# Patient Record
Sex: Male | Born: 1975 | Race: White | Hispanic: No | Marital: Married | Smoking: Current every day smoker
Health system: Southern US, Community
[De-identification: ages and names within clinical notes are randomized; demographics above are authoritative.]

## PROBLEM LIST (undated history)

## (undated) DIAGNOSIS — Z789 Other specified health status: Secondary | ICD-10-CM

## (undated) HISTORY — PX: ANKLE SURGERY: SHX546

---

## 2006-06-07 ENCOUNTER — Ambulatory Visit: Payer: Self-pay | Admitting: Family Medicine

## 2006-06-07 DIAGNOSIS — L0100 Impetigo, unspecified: Secondary | ICD-10-CM

## 2006-11-29 ENCOUNTER — Ambulatory Visit: Payer: Self-pay | Admitting: Family Medicine

## 2006-11-29 DIAGNOSIS — R1013 Epigastric pain: Secondary | ICD-10-CM | POA: Insufficient documentation

## 2006-12-13 ENCOUNTER — Telehealth: Payer: Self-pay | Admitting: Family Medicine

## 2009-08-11 ENCOUNTER — Ambulatory Visit: Payer: Self-pay | Admitting: Family Medicine

## 2009-08-11 DIAGNOSIS — L659 Nonscarring hair loss, unspecified: Secondary | ICD-10-CM | POA: Insufficient documentation

## 2009-08-12 ENCOUNTER — Encounter: Payer: Self-pay | Admitting: Family Medicine

## 2009-08-29 ENCOUNTER — Ambulatory Visit: Payer: Self-pay | Admitting: Family Medicine

## 2010-05-05 NOTE — Assessment & Plan Note (Signed)
Summary: alopecia   Vital Signs:  Patient profile:   35 year old male Height:      70 inches Weight:      150 pounds BMI:     21.60 O2 Sat:      100 % on Room air Pulse rate:   84 / minute BP sitting:   132 / 83  (left arm) Cuff size:   regular  Vitals Entered By: Payton Spark CMA (Aug 29, 2009 1:50 PM)  O2 Flow:  Room air CC: F/U hair loss.    Primary Care Provider:  Nani Gasser MD  CC:  F/U hair loss. Marland Kitchen  History of Present Illness: 35 yo WM presents for alopecia f/u.  He saw Dr Linford Arnold a month ago.  Had a neg KOH prep.  She put him on Griseofulvin but it has not helped.  He seems to be getting worse everytime he uses his clippers.  he is not soaking them in an antifungal solution.  Denies any itching or scaling.  O/W feels fine.  Current Medications (verified): 1)  Griseofulvin Ultramicrosize 250 Mg Tabs (Griseofulvin Ultramicrosize) .... Take 1 Tablet By Mouth Two Times A Day For 6 Weeks.  Allergies (verified): No Known Drug Allergies  Past History:  Social History: Reviewed history from 06/07/2006 and no changes required. 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  Married to [employer] and no kids.   Current Smoker Drug use-yes Drug use-no Regular exercise-no  Review of Systems      See HPI  Physical Exam  General:  alert, well-developed, well-nourished, and well-hydrated.  here with wife Head:  moth eated appearance of diffuse alopecia, non - scarring. Neck:  no masses.   Skin:  no flaking or scaling Psych:  depressed affect.     Impression & Recommendations:  Problem # 1:  HAIR LOSS (ICD-704.00)  Moth - eating appearance alopecia.  The most common cause is tinea capitus but he has not responded to 4 wks of griseofulvin and had a neg KOH prep.    Will get him in with dermatology to follow.    Orders: Dermatology Referral (Derma)  Complete Medication List: 1)  Griseofulvin Ultramicrosize 250 Mg Tabs (Griseofulvin ultramicrosize) .... Take 1  tablet by mouth two times a day for 6 weeks.  Patient Instructions: 1)  Will get you in with dermatology in [place] for alopecia. 2)  Stop the Griseofulvin.   3)  Clean clippers with fungicide.

## 2010-05-05 NOTE — Assessment & Plan Note (Signed)
Summary: Hair loss   Vital Signs:  Patient profile:   35 year old male Height:      70 inches (177.80 cm) Weight:      153 pounds (69.55 kg) BMI:     22.03 Pulse rate:   68 / minute BP sitting:   123 / 81  (left arm) Cuff size:   regular  Vitals Entered By: Kathlene November (Aug 11, 2009 2:45 PM) CC: loosing patches of hair for 1 month now and getting worse   Primary Care Provider:  Linford Arnold, C  CC:  loosing patches of hair for 1 month now and getting worse.  History of Present Illness: loosing patches of hair for 1 month now and getting worse.  Has had some regrowth of hair in the orginal lesions.  No change in shampoos, soaps or medications that would cause hair loss. It has not affected his beard at all.  Occ itchy.   Current Medications (verified): 1)  None  Allergies (verified): No Known Drug Allergies  Comments:  Nurse/Medical Assistant: The patient's medications and allergies were reviewed with the patient and were updated in the Medication and Allergy Lists. Kathlene November (Aug 11, 2009 2:47 PM)  Physical Exam  General:  Well-developed,well-nourished,in no acute distress; alert,appropriate and cooperative throughout examination Skin:  Diffuse circular areas of hairloss that range from 0.5 cm to 5cm.  All have some ne hair growth. No scaring.  No red scaling or erythema.  They are scattered all over his scalp. No loss on his beard.     Impression & Recommendations:  Problem # 1:  HAIR LOSS (ICD-704.00) BAsed on ciruclar nature suspect fungal infection (tinea capitis).  KOH scraping sent.  In meantime recommend starting a selsun blue or nizoral shampoo instead thought topical tx are usually not effective. Will start griseofulvin.  Orders: T-KOH Prep Fungal ([id number])  Complete Medication List: 1)  Griseofulvin Ultramicrosize 250 Mg Tabs (Griseofulvin ultramicrosize) .... Take 1 tablet by mouth two times a day for 6 weeks. Prescriptions: GRISEOFULVIN ULTRAMICROSIZE  250 MG TABS (GRISEOFULVIN ULTRAMICROSIZE) Take 1 tablet by mouth two times a day for 6 weeks.  #60 x 1   Entered and Authorized by:   Nani Gasser MD   Signed by:   Nani Gasser MD on 08/11/2009   Method used:   Electronically to        [facility] [phone]* (retail)       [address] [place], [place]  [address]       Ph: [phone] or [phone]       Fax: [phone]   RxID:   [id number]

## 2010-07-23 ENCOUNTER — Encounter: Payer: Self-pay | Admitting: Family Medicine

## 2010-07-24 ENCOUNTER — Ambulatory Visit
Admission: RE | Admit: 2010-07-24 | Discharge: 2010-07-24 | Disposition: A | Discharge status: Home / Self Care | Payer: Commercial Indemnity | Source: Ambulatory Visit | Attending: Family Medicine | Admitting: Family Medicine

## 2010-07-24 ENCOUNTER — Ambulatory Visit (INDEPENDENT_AMBULATORY_CARE_PROVIDER_SITE_OTHER): Payer: Commercial Indemnity | Admitting: Family Medicine

## 2010-07-24 VITALS — BP 125/81 | HR 73 | Ht 68.75 in | Wt 144.0 lb

## 2010-07-24 DIAGNOSIS — M79609 Pain in unspecified limb: Secondary | ICD-10-CM

## 2010-07-24 DIAGNOSIS — M79673 Pain in unspecified foot: Secondary | ICD-10-CM

## 2010-07-24 NOTE — Patient Instructions (Signed)
Ibuprofen 200mg  (3 tab up to 3 x a day with food) for swelling and inflammation and pain relief.   We will you with the xray report on Monday.

## 2010-07-24 NOTE — Progress Notes (Signed)
  Subjective:    Patient ID: Jimmy Mcknight, male    DOB: [birth year], 35 y.o.   MRN: [id number]  HPI Left  Foot pain for 3 weeks. Fels like a sharp pain top of outer foot radiating to the lateral malleolus.  No meds for pain.  Worse with going up and on step. Pain is better at rest.  Never saw a bruise or cut No trauma.  Build excavators for a living.    Review of Systems     Objective:   Physical Exam  Constitutional: He appears well-developed and well-nourished.  Musculoskeletal:       Left foot with tenderness over the lateral foot near the talus. Ankle with NROM. No laxity of the joint.  DP pulse 2+. No edema, bruising or rash.           Assessment & Plan:  Ankle pain- will get xray to rule out fracture. If neg can consider a boot to rest the foot or a podiatry referral. I dn't think this is a neuroma or a sprain.

## 2010-07-26 ENCOUNTER — Telehealth: Payer: Self-pay | Admitting: Family Medicine

## 2010-07-26 DIAGNOSIS — M79673 Pain in unspecified foot: Secondary | ICD-10-CM

## 2010-07-26 NOTE — Telephone Encounter (Signed)
Call pt: NO fracture on xray. Can either try wearing a boot or can refer to podiatry for further evaluation.

## 2010-07-27 NOTE — Telephone Encounter (Signed)
Ref entered

## 2010-07-27 NOTE — Telephone Encounter (Signed)
Pt notified and wants referral to podiatrist

## 2011-03-08 ENCOUNTER — Other Ambulatory Visit: Payer: Self-pay | Admitting: Podiatry

## 2011-03-08 DIAGNOSIS — M79672 Pain in left foot: Secondary | ICD-10-CM

## 2011-03-08 DIAGNOSIS — M25572 Pain in left ankle and joints of left foot: Secondary | ICD-10-CM

## 2011-03-12 ENCOUNTER — Ambulatory Visit
Admission: RE | Admit: 2011-03-12 | Discharge: 2011-03-12 | Disposition: A | Discharge status: Home / Self Care | Payer: Commercial Indemnity | Source: Ambulatory Visit | Attending: Podiatry | Admitting: Podiatry

## 2011-03-12 DIAGNOSIS — M25572 Pain in left ankle and joints of left foot: Secondary | ICD-10-CM

## 2011-03-12 DIAGNOSIS — M79672 Pain in left foot: Secondary | ICD-10-CM

## 2011-03-12 MED ORDER — GADOBENATE DIMEGLUMINE 529 MG/ML IV SOLN
14.0000 mL | Freq: Once | INTRAVENOUS | Status: AC | PRN
  Administered 2011-03-12: 14 mL via INTRAVENOUS

## 2013-04-05 HISTORY — PX: ANKLE SURGERY: SHX546

## 2017-08-27 ENCOUNTER — Emergency Department (INDEPENDENT_AMBULATORY_CARE_PROVIDER_SITE_OTHER)
Admission: EM | Admit: 2017-08-27 | Discharge: 2017-08-27 | Disposition: A | Discharge status: Home / Self Care | Payer: 59 | Source: Home / Self Care | Attending: Emergency Medicine | Admitting: Emergency Medicine

## 2017-08-27 ENCOUNTER — Other Ambulatory Visit: Payer: Self-pay

## 2017-08-27 DIAGNOSIS — L03818 Cellulitis of other sites: Secondary | ICD-10-CM | POA: Diagnosis not present

## 2017-08-27 LAB — POCT CBC W AUTO DIFF (K'VILLE URGENT CARE)

## 2017-08-27 MED ORDER — DOXYCYCLINE HYCLATE 100 MG PO CAPS: 100.0000 mg | ORAL_CAPSULE | Freq: Two times a day (BID) | ORAL | 0 refills | Status: DC

## 2017-08-27 MED ORDER — TRAMADOL HCL 50 MG PO TABS: ORAL_TABLET | ORAL | 0 refills | Status: DC

## 2017-08-27 NOTE — Discharge Instructions (Addendum)
Take antibiotics as instructed. Use ibuprofen for pain. You have Ultram to take for breakthrough pain. If you have progression of the redness around the marked area by this evening please go to the emergency room. Please follow-up with your primary care physician next week.

## 2017-08-27 NOTE — ED Provider Notes (Signed)
[facility]    CSN: [id number] Arrival date & time: 08/27/17  8657     History   Chief Complaint Chief Complaint  Patient presents with  . Leg Pain    Back of L leg    HPI Jimmy Mcknight is a 42 y.o. male.   HPI Patient was in his usual state of health until Wednesday or Thursday when he developed severe pain in the popliteal area of his left leg.  He noticed a red area behind his knee.  Since then he has had progressive increase in redness and tenderness around that area.  The area has become extremely painful.  He does not know of any specific bite to the area or injury to the area.  He has no previous history of MRSA. History reviewed. No pertinent past medical history.  Patient Active Problem List   Diagnosis Date Noted  . HAIR LOSS 08/11/2009  . EPIGASTRIC PAIN 11/29/2006    History reviewed. No pertinent surgical history.     Home Medications    Prior to Admission medications   Medication Sig Start Date End Date Taking? Authorizing Provider  doxycycline (VIBRAMYCIN) 100 MG capsule Take 1 capsule (100 mg total) by mouth 2 (two) times daily. 08/27/17   Collene Gobble, MD  traMADol (ULTRAM) 50 MG tablet 1 every 6 hours as needed for pain. 08/27/17   Collene Gobble, MD    Family History History reviewed. No pertinent family history.  Social History Social History   Tobacco Use  . Smoking status: Current Every Day Smoker  . Smokeless tobacco: Never Used  Substance Use Topics  . Alcohol use: Yes    Comment: OCC  . Drug use: Yes     Allergies   Tylenol [acetaminophen]   Review of Systems Review of Systems  Constitutional: Negative for fever.  Respiratory: Negative.   Gastrointestinal: Negative.   Musculoskeletal:       Severe pain behind his left knee.     Physical Exam Triage Vital Signs ED Triage Vitals [08/27/17 1024]  Enc Vitals Group     BP 126/81     Pulse Rate 87     Resp      Temp 98.4 F (36.9 C)     Temp Source Oral       SpO2 98 %     Weight 157 lb (71.2 kg)     Height  (1.778 m)     Head Circumference      Peak Flow      Pain Score 7     Pain Loc      Pain Edu?      Excl. in GC?    No data found.  Updated Vital Signs BP 126/81 (BP Location: Right Arm)   Pulse 87   Temp 98.4 F (36.9 C) (Oral)   Ht  (1.778 m)   Wt 157 lb (71.2 kg)   SpO2 98%   BMI 22.53 kg/m   Visual Acuity Right Eye Distance:   Left Eye Distance:   Bilateral Distance:    Right Eye Near:   Left Eye Near:    Bilateral Near:     Physical Exam  Musculoskeletal:  Positive findings are related to the left popliteal area.  There is an 18 x 15 cm area of redness.  There is a central area 3 x 3 cm which is exquisitely tender and has a central punctum.     UC Treatments /  Results  Labs (all labs ordered are listed, but only abnormal results are displayed) Labs Reviewed  POCT CBC W AUTO DIFF ([facility])    EKG None  Radiology No results found.  Procedures Procedures (including critical care time)  Medications Ordered in UC Medications - No data to display  Initial Impression / Assessment and Plan / UC Course  I have reviewed the triage vital signs and the nursing notes.  Pertinent labs & imaging results that were available during my care of the patient were reviewed by me and considered in my medical decision making (see chart for details). This could be an insect bite or spider bite to the back of his left knee.  It may also be a cyst that has become infected.  We will treat with doxycycline 100 mg twice a day.  He was given instructions to go to the emergency room if any worsening or progression of the marked areas of cellulitis.  CBC shows a white count 7300 hemoglobin 13.3 platelet count 251,000.  He is afebrile.     Final Clinical Impressions(s) / UC Diagnoses   Final diagnoses:  Cellulitis of other specified site     Discharge Instructions     Take antibiotics as  instructed. Use ibuprofen for pain. You have Ultram to take for breakthrough pain. If you have progression of the redness around the marked area by this evening please go to the emergency room.    ED Prescriptions    Medication Sig Dispense Auth. Provider   doxycycline (VIBRAMYCIN) 100 MG capsule Take 1 capsule (100 mg total) by mouth 2 (two) times daily. 20 capsule Collene Gobble, MD   traMADol (ULTRAM) 50 MG tablet 1 every 6 hours as needed for pain. 15 tablet Collene Gobble, MD     Controlled Substance Prescriptions [place] Controlled Substance Registry consulted? Yes, I have consulted the [place] Controlled Substances Registry for this patient, and feel the risk/benefit ratio today is favorable for proceeding with this prescription for a controlled substance.   Collene Gobble, MD 08/27/17 1135

## 2017-08-27 NOTE — ED Triage Notes (Signed)
Pt c/o red bump on back of L leg x 2 days. Red and hot to touch. Not sure if he was bit by anything. Has tried "pain cream" and ibuprofen with no relief.

## 2017-08-28 ENCOUNTER — Telehealth: Payer: Self-pay | Admitting: Emergency Medicine

## 2017-08-29 NOTE — Telephone Encounter (Signed)
Attempted to call, phone not in service.

## 2017-08-30 ENCOUNTER — Encounter: Payer: Self-pay | Admitting: Emergency Medicine

## 2017-08-30 ENCOUNTER — Emergency Department (INDEPENDENT_AMBULATORY_CARE_PROVIDER_SITE_OTHER)
Admission: EM | Admit: 2017-08-30 | Discharge: 2017-08-30 | Disposition: A | Discharge status: Home / Self Care | Payer: 59 | Source: Home / Self Care | Attending: Emergency Medicine | Admitting: Emergency Medicine

## 2017-08-30 ENCOUNTER — Other Ambulatory Visit: Payer: Self-pay

## 2017-08-30 DIAGNOSIS — L02416 Cutaneous abscess of left lower limb: Secondary | ICD-10-CM | POA: Diagnosis not present

## 2017-08-30 DIAGNOSIS — L03116 Cellulitis of left lower limb: Secondary | ICD-10-CM | POA: Diagnosis not present

## 2017-08-30 MED ORDER — TRAMADOL HCL 50 MG PO TABS: 50.0000 mg | ORAL_TABLET | Freq: Four times a day (QID) | ORAL | 0 refills | Status: DC | PRN

## 2017-08-30 NOTE — Discharge Instructions (Addendum)
°  Recheck in the morning. Continue antibiotics. Apply heat to the area. You have Ultram for breakthrough pain.

## 2017-08-30 NOTE — ED Provider Notes (Addendum)
[facility]    CSN: [id number] Arrival date & time: 08/30/17  1404     History   Chief Complaint Chief Complaint  Patient presents with  . Leg Pain    left leg    HPI Jimmy Mcknight is a 42 y.o. male.  Patient seen 08/27/2017 with what appeared to be a cellulitis in the left popliteal area.  He was started on doxycycline and given Ultram for breakthrough pain.  Since that time he has had increasing discomfort in the back of his left knee with difficulty squatting.  He has had no fever chills or other symptoms.  He has been able to eat without difficulty.  The area of redness around the back of his knee has improved.  He has developed a central swollen area which is significantly tender to touch.  Leg Pain    History reviewed. No pertinent past medical history.  Patient Active Problem List   Diagnosis Date Noted  . HAIR LOSS 08/11/2009  . EPIGASTRIC PAIN 11/29/2006    History reviewed. No pertinent surgical history.     Home Medications    Prior to Admission medications   Medication Sig Start Date End Date Taking? Authorizing Provider  doxycycline (VIBRAMYCIN) 100 MG capsule Take 1 capsule (100 mg total) by mouth 2 (two) times daily. 08/27/17   Collene Gobble, MD  traMADol (ULTRAM) 50 MG tablet 1 every 6 hours as needed for pain. 08/27/17   Collene Gobble, MD  traMADol (ULTRAM) 50 MG tablet Take 1 tablet (50 mg total) by mouth every 6 (six) hours as needed. 08/30/17   Collene Gobble, MD    Family History History reviewed. No pertinent family history.  Social History Social History   Tobacco Use  . Smoking status: Current Every Day Smoker  . Smokeless tobacco: Never Used  Substance Use Topics  . Alcohol use: Yes    Comment: OCC  . Drug use: Yes     Allergies   Tylenol [acetaminophen]   Review of Systems Review of Systems  Constitutional: Negative.   Gastrointestinal: Negative.   Musculoskeletal:       Patient has pain and swelling behind  his left knee.     Physical Exam Triage Vital Signs ED Triage Vitals  Enc Vitals Group     BP 08/30/17 1421 (!) 144/88     Pulse Rate 08/30/17 1421 99     Resp --      Temp 08/30/17 1421 98.3 F (36.8 C)     Temp Source 08/30/17 1421 Oral     SpO2 08/30/17 1421 97 %     Weight 08/30/17 1422 158 lb (71.7 kg)     Height 08/30/17 1422  (1.778 m)     Head Circumference --      Peak Flow --      Pain Score 08/30/17 1421 7     Pain Loc --      Pain Edu? --      Excl. in GC? --    No data found.  Updated Vital Signs BP (!) 144/88 (BP Location: Left Arm)   Pulse 99   Temp 98.3 F (36.8 C) (Oral)   Ht  (1.778 m)   Wt 158 lb (71.7 kg)   SpO2 97%   BMI 22.67 kg/m   Visual Acuity Right Eye Distance:   Left Eye Distance:   Bilateral Distance:    Right Eye Near:   Left Eye Near:  Bilateral Near:     Physical Exam  Skin:  There is a 2 x 3 cm raised listening mass behind the left knee in the popliteal area.  There is approximately 2 cm of surrounding redness.  The central area is fluctuant.  There are some peripheral pustule-like areas.  The area was cleaned with Betadine x2.  The area was swabbed with saline.  2 cc of 2% plain was injected.  Along with numbing spray.  After this was done a 1-1/2 cm incision was made with return of approximately 2 cc of purulent material along with what appears to be a rim of cyst.  Patient tolerated the procedure well.  2 cm of packing quarter inch was placed.     UC Treatments / Results  Labs (all labs ordered are listed, but only abnormal results are displayed) Labs Reviewed  WOUND CULTURE    EKG None  Radiology No results found.  Procedures Incision and Drainage Date/Time: 08/30/2017 3:00 PM Performed by: Collene Gobble, MD Authorized by: Collene Gobble, MD   Consent:    Consent obtained:  Verbal Location:    Type:  Abscess   Size:  2 x 3 cm Pre-procedure details:    Skin preparation:  Antiseptic wash and  Betadine Anesthesia (see MAR for exact dosages):    Anesthesia method:  Topical application and local infiltration   Local anesthetic:  Lidocaine 1% w/o epi Procedure type:    Complexity:  Simple Procedure details:    Needle aspiration: no     Incision types:  Stab incision   Incision depth:  Subcutaneous   Scalpel blade:  11   Wound management:  Probed and deloculated and debrided   Drainage:  Purulent   Drainage amount:  Copious   Wound treatment:  Drain placed   Packing materials:  1/4 in gauze Post-procedure details:    Patient tolerance of procedure:  Tolerated well, no immediate complications   (including critical care time)  Medications Ordered in UC Medications - No data to display  Initial Impression / Assessment and Plan / UC Course  I have reviewed the triage vital signs and the nursing notes.  Pertinent labs & imaging results that were available during my care of the patient were reviewed by me and considered in my medical decision making (see chart for details).     Patient currently has been on Doxy.  3 days ago there was no drainage to culture.  The surrounding area of redness has improved however he has developed a 2 x 3 cm abscess centrally.  I&D was performed of this abscess.  Culture was sent.  He will continue on doxycycline.  He was given a note to be out of work for the next 3 days and to return to clinic in the morning for packing change.  Patient tolerated the procedure well.  I did refill his Ultram to help with pain.  He has an appointment in the morning for a removal of a cyst on the back of his neck. Final Clinical Impressions(s) / UC Diagnoses   Final diagnoses:  Abscess of left leg  Cellulitis of leg, left     Discharge Instructions      Recheck in the morning. Continue antibiotics. Apply heat to the area. You have Ultram for breakthrough pain.     ED Prescriptions    Medication Sig Dispense Auth. Provider   traMADol (ULTRAM) 50 MG  tablet Take 1 tablet (50 mg total) by mouth every  6 (six) hours as needed. 15 tablet Collene Gobble, MD     Controlled Substance Prescriptions [place] Controlled Substance Registry consulted? Yes, I have consulted the [place] Controlled Substances Registry for this patient, and feel the risk/benefit ratio today is favorable for proceeding with this prescription for a controlled substance.   Collene Gobble, MD 08/30/17 1541    Collene Gobble, MD 08/30/17 1542    Collene Gobble, MD 08/30/17 [phone]

## 2017-08-30 NOTE — ED Triage Notes (Signed)
42 y.o male presents for a recheck of the L leg. Was seen on 5/25, currently taking doxycycline. States the redness has subsided but the pain has worsened.

## 2017-08-31 ENCOUNTER — Emergency Department (INDEPENDENT_AMBULATORY_CARE_PROVIDER_SITE_OTHER)
Admission: EM | Admit: 2017-08-31 | Discharge: 2017-08-31 | Disposition: A | Discharge status: Home / Self Care | Payer: 59 | Source: Home / Self Care | Attending: Emergency Medicine | Admitting: Emergency Medicine

## 2017-08-31 ENCOUNTER — Encounter: Payer: Self-pay | Admitting: *Deleted

## 2017-08-31 ENCOUNTER — Other Ambulatory Visit: Payer: Self-pay

## 2017-08-31 DIAGNOSIS — L02416 Cutaneous abscess of left lower limb: Secondary | ICD-10-CM

## 2017-08-31 NOTE — Discharge Instructions (Addendum)
The abscess left leg was lanced yesterday and packing placed. The results of wound culture from yesterday are not back yet. Today, I removed packing and replaced with new packing.  The infection is improving.  Continue taking the doxycycline antibiotic. Keep the wound area covered with bandage. Return here to urgent care tomorrow to recheck the wound, remove packing and possibly replacing with new packing. Hopefully, wound culture results will be back by tomorrow.

## 2017-08-31 NOTE — ED Triage Notes (Signed)
Pt is here today for a recheck of his leg wound.

## 2017-08-31 NOTE — ED Provider Notes (Addendum)
[facility]    CSN: [id number] Arrival date & time: 08/31/17  0900     History   Chief Complaint Chief Complaint  Patient presents with  . Dressing Change    HPI Jimmy Mcknight is a 42 y.o. male.   HPI Here to recheck left leg wound status post I&D here yesterday. Reviewed notes from visit here yesterday. He feels pain is improving.  Now improved to 3 out of 10 intensity.  Took tramadol earlier this morning and that really helped the pain.   Not needing pain meds at this time. No past medical history on file.  Patient Active Problem List   Diagnosis Date Noted  . HAIR LOSS 08/11/2009  . EPIGASTRIC PAIN 11/29/2006    No past surgical history on file.     Home Medications    Prior to Admission medications   Medication Sig Start Date End Date Taking? Authorizing Provider  doxycycline (VIBRAMYCIN) 100 MG capsule Take 1 capsule (100 mg total) by mouth 2 (two) times daily. 08/27/17   Collene Gobble, MD  traMADol (ULTRAM) 50 MG tablet 1 every 6 hours as needed for pain. 08/27/17   Collene Gobble, MD  traMADol (ULTRAM) 50 MG tablet Take 1 tablet (50 mg total) by mouth every 6 (six) hours as needed. 08/30/17   Collene Gobble, MD    Family History No family history on file.  Social History Social History   Tobacco Use  . Smoking status: Current Every Day Smoker  . Smokeless tobacco: Never Used  Substance Use Topics  . Alcohol use: Yes    Comment: OCC  . Drug use: Yes     Allergies   Tylenol [acetaminophen]   Review of Systems Review of Systems  No fever or chills or nausea or vomiting Physical Exam Triage Vital Signs ED Triage Vitals [08/31/17 0929]  Enc Vitals Group     BP 129/86     Pulse Rate 81     Resp 16     Temp 98.4 F (36.9 C)     Temp Source Oral     SpO2 97 %     Weight      Height      Head Circumference      Peak Flow      Pain Score 3     Pain Loc      Pain Edu?      Excl. in GC?    No data found.  Updated Vital  Signs BP 129/86 (BP Location: Right Arm)   Pulse 81   Temp 98.4 F (36.9 C) (Oral)   Resp 16   SpO2 97%   Visual Acuity Right Eye Distance:   Left Eye Distance:   Bilateral Distance:    Right Eye Near:   Left Eye Near:    Bilateral Near:     Physical Exam  Left leg wound, tender, minimal drainage.  I replaced the packing.  No red streaks UC Treatments / Results  Labs (all labs ordered are listed, but only abnormal results are displayed) Labs Reviewed - No data to display  EKG None  Radiology No results found.  Procedures Procedures (including critical care time)  Medications Ordered in UC Medications - No data to display  Initial Impression / Assessment and Plan / UC Course  I have reviewed the triage vital signs and the nursing notes.  Pertinent labs & imaging results that were available during my care of the patient were  reviewed by me and considered in my medical decision making (see chart for details).      Final Clinical Impressions(s) / UC Diagnoses   Final diagnoses:  Abscess of leg, left     Discharge Instructions     The abscess left leg was lanced yesterday and packing placed. The results of wound culture from yesterday are not back yet. Today, I removed packing and replaced with new packing.  The infection is improving.  Continue taking the doxycycline antibiotic. Keep the wound area covered with bandage. Return here to urgent care tomorrow to recheck the wound, remove packing and possibly replacing with new packing. Hopefully, wound culture results will be back by tomorrow.   ED Prescriptions    None     Controlled Substance Prescriptions [place] Controlled Substance Registry consulted? Not Applicable   Lajean Manes, MD 08/31/17 8657    Lajean Manes, MD 08/31/17 [phone]

## 2017-09-01 ENCOUNTER — Other Ambulatory Visit: Payer: Self-pay

## 2017-09-01 ENCOUNTER — Emergency Department (INDEPENDENT_AMBULATORY_CARE_PROVIDER_SITE_OTHER)
Admission: EM | Admit: 2017-09-01 | Discharge: 2017-09-01 | Disposition: A | Discharge status: Home / Self Care | Payer: 59 | Source: Home / Self Care

## 2017-09-01 DIAGNOSIS — L03119 Cellulitis of unspecified part of limb: Secondary | ICD-10-CM

## 2017-09-01 DIAGNOSIS — L02419 Cutaneous abscess of limb, unspecified: Secondary | ICD-10-CM | POA: Diagnosis not present

## 2017-09-01 DIAGNOSIS — Z5189 Encounter for other specified aftercare: Secondary | ICD-10-CM

## 2017-09-01 NOTE — Discharge Instructions (Addendum)
Continue current care.  Return tomorrow evening or Saturday to have packing removed.  Continue taking antibiotics.

## 2017-09-01 NOTE — ED Provider Notes (Signed)
[facility]    CSN: [id number] Arrival date & time: 09/01/17  4098     History   Chief Complaint Chief Complaint  Patient presents with  . Wound Check    HPI Jimmy Mcknight is a 42 y.o. male.   HPI Patient had an I&D done of an abscess on his left popliteal fossa a couple of days ago.  Pain has greatly diminished. History reviewed. No pertinent past medical history.  Patient Active Problem List   Diagnosis Date Noted  . HAIR LOSS 08/11/2009  . EPIGASTRIC PAIN 11/29/2006    History reviewed. No pertinent surgical history.     Home Medications    Prior to Admission medications   Medication Sig Start Date End Date Taking? Authorizing Provider  doxycycline (VIBRAMYCIN) 100 MG capsule Take 1 capsule (100 mg total) by mouth 2 (two) times daily. 08/27/17   Collene Gobble, MD  traMADol (ULTRAM) 50 MG tablet 1 every 6 hours as needed for pain. 08/27/17   Collene Gobble, MD  traMADol (ULTRAM) 50 MG tablet Take 1 tablet (50 mg total) by mouth every 6 (six) hours as needed. 08/30/17   Collene Gobble, MD    Family History History reviewed. No pertinent family history.  Social History Social History   Tobacco Use  . Smoking status: Current Every Day Smoker  . Smokeless tobacco: Never Used  Substance Use Topics  . Alcohol use: Yes    Comment: OCC  . Drug use: Yes     Allergies   Tylenol [acetaminophen]   Review of Systems Review of Systems No new issues.  Physical Exam Triage Vital Signs ED Triage Vitals  Enc Vitals Group     BP 09/01/17 0844 134/83     Pulse Rate 09/01/17 0844 78     Resp --      Temp 09/01/17 0844 98.4 F (36.9 C)     Temp Source 09/01/17 0844 Oral     SpO2 09/01/17 0844 98 %     Weight 09/01/17 0845 159 lb (72.1 kg)     Height --      Head Circumference --      Peak Flow --      Pain Score --      Pain Loc --      Pain Edu? --      Excl. in GC? --    No data found.  Updated Vital Signs BP 134/83 (BP Location:  Right Arm)   Pulse 78   Temp 98.4 F (36.9 C) (Oral)   Wt 159 lb (72.1 kg)   SpO2 98%   BMI 22.81 kg/m   Visual Acuity Right Eye Distance:   Left Eye Distance:   Bilateral Distance:    Right Eye Near:   Left Eye Near:    Bilateral Near:     Physical Exam  Dressing removed and packing removed.  No surrounding erythema.  1 cm hole where the I&D was performed.  Packing re-inserted.  It had a moderate amount of drainage.  Seems to be healing well. UC Treatments / Results  Labs (all labs ordered are listed, but only abnormal results are displayed) Labs Reviewed - No data to display  EKG None  Radiology No results found.  Procedures Procedures (including critical care time)  Medications Ordered in UC Medications - No data to display  Initial Impression / Assessment and Plan / UC Course  I have reviewed the triage vital signs and the nursing  notes.  Pertinent labs & imaging results that were available during my care of the patient were reviewed by me and considered in my medical decision making (see chart for details).     Abscess leg, here for packing removal and recheck. Final Clinical Impressions(s) / UC Diagnoses   Final diagnoses:  Visit for wound check  Cellulitis and abscess of leg     Discharge Instructions     Continue current care.  Return tomorrow evening or Saturday to have packing removed.  Continue taking antibiotics.    ED Prescriptions    None     Controlled Substance Prescriptions [place] Controlled Substance Registry consulted? No   Peyton Najjar, MD 09/01/17 [phone]

## 2017-09-01 NOTE — ED Triage Notes (Signed)
Pt here for wound check.

## 2017-09-02 ENCOUNTER — Telehealth: Payer: Self-pay | Admitting: Emergency Medicine

## 2017-09-02 LAB — WOUND CULTURE
MICRO NUMBER: 90642302
RESULT: NO GROWTH
SPECIMEN QUALITY: ADEQUATE

## 2017-09-02 NOTE — Telephone Encounter (Signed)
Leg is getting better, no growth

## 2017-09-03 ENCOUNTER — Encounter: Payer: Self-pay | Admitting: Emergency Medicine

## 2017-09-03 ENCOUNTER — Emergency Department (INDEPENDENT_AMBULATORY_CARE_PROVIDER_SITE_OTHER)
Admission: EM | Admit: 2017-09-03 | Discharge: 2017-09-03 | Disposition: A | Discharge status: Home / Self Care | Payer: 59 | Source: Home / Self Care | Attending: Family Medicine | Admitting: Family Medicine

## 2017-09-03 DIAGNOSIS — Z5189 Encounter for other specified aftercare: Secondary | ICD-10-CM

## 2017-09-03 DIAGNOSIS — Z4800 Encounter for change or removal of nonsurgical wound dressing: Secondary | ICD-10-CM

## 2017-09-03 NOTE — ED Triage Notes (Signed)
Patient is here for a wound check, located behind left knee. Denies pain.

## 2017-09-03 NOTE — ED Provider Notes (Signed)
[facility]    CSN: [id number] Arrival date & time: 09/03/17  0936     History   Chief Complaint Chief Complaint  Patient presents with  . Wound Check    HPI Garnet SierrasScott A Cornacchia is a 42 y.o. male.   HPI  Garnet SierrasScott A Bulson is a 42 y.o. male presenting to UC for packing removal from [facility]. Pt is taking the antibiotics as prescribed and doing well. Pain has improved significantly since initial presentation.  Denies fever, chills, n/v/d.    History reviewed. No pertinent past medical history.  Patient Active Problem List   Diagnosis Date Noted  . HAIR LOSS 08/11/2009  . EPIGASTRIC PAIN 11/29/2006    History reviewed. No pertinent surgical history.     Home Medications    Prior to Admission medications   Medication Sig Start Date End Date Taking? Authorizing Provider  doxycycline (VIBRAMYCIN) 100 MG capsule Take 1 capsule (100 mg total) by mouth 2 (two) times daily. 08/27/17   Collene Gobbleaub, Steven A, MD  traMADol (ULTRAM) 50 MG tablet 1 every 6 hours as needed for pain. 08/27/17   Collene Gobbleaub, Steven A, MD  traMADol (ULTRAM) 50 MG tablet Take 1 tablet (50 mg total) by mouth every 6 (six) hours as needed. 08/30/17   Collene Gobbleaub, Steven A, MD    Family History History reviewed. No pertinent family history.  Social History Social History   Tobacco Use  . Smoking status: Current Every Day Smoker  . Smokeless tobacco: Never Used  Substance Use Topics  . Alcohol use: Yes    Comment: OCC  . Drug use: Yes     Allergies   Tylenol [acetaminophen]   Review of Systems Review of Systems  Constitutional: Negative for chills and fever.  Musculoskeletal: Negative for arthralgias, joint swelling and myalgias.     Physical Exam Triage Vital Signs ED Triage Vitals  Enc Vitals Group     BP 09/03/17 1016 113/79     Pulse Rate 09/03/17 1016 (!) 104     Resp 09/03/17 1016 16     Temp 09/03/17 1016 98 F (36.7 C)     Temp Source 09/03/17 1016 Oral    SpO2 09/03/17 1016 99 %     Weight 09/03/17 1017 160 lb (72.6 kg)     Height 09/03/17 1017 5\' 10"  (1.778 m)     Head Circumference --      Peak Flow --      Pain Score 09/03/17 1017 0     Pain Loc --      Pain Edu? --      Excl. in GC? --    No data found.  Updated Vital Signs BP 113/79 (BP Location: Right Arm)   Pulse (!) 104   Temp 98 F (36.7 C) (Oral)   Resp 16   Ht 5\' 10"  (1.778 m)   Wt 160 lb (72.6 kg)   SpO2 99%   BMI 22.96 kg/m   Visual Acuity Right Eye Distance:   Left Eye Distance:   Bilateral Distance:    Right Eye Near:   Left Eye Near:    Bilateral Near:     Physical Exam  Constitutional: He appears well-developed and well-nourished. No distress.  Cardiovascular: Regular rhythm.  Pulmonary/Chest: Effort normal.  Musculoskeletal: Normal range of motion. He exhibits tenderness. He exhibits no edema.  Left knee: full ROM, mild tenderness around wound  Skin: Skin is warm and dry. He  is not diaphoretic.  Left posterior knee: well healing abscess with scant yellow discharge with packing in place. After removing packing- granulation tissue forming. Minimally tender. No surrounding fluctuance or induration.   Nursing note and vitals reviewed.    UC Treatments / Results  Labs (all labs ordered are listed, but only abnormal results are displayed) Labs Reviewed - No data to display  EKG None  Radiology No results found.  Procedures Procedures (including critical care time)  Medications Ordered in UC Medications - No data to display  Initial Impression / Assessment and Plan / UC Course  I have reviewed the triage vital signs and the nursing notes.  Pertinent labs & imaging results that were available during my care of the patient were reviewed by me and considered in my medical decision making (see chart for details).     Packing removed. Wound rinsed with saline New packing replaced Home care instructions provided below.   Final Clinical  Impressions(s) / UC Diagnoses   Final diagnoses:  Change or removal of wound packing  Abscess re-check     Discharge Instructions      Please return to urgent care in 2 days for packing removal and wound recheck.     ED Prescriptions    None     Controlled Substance Prescriptions [place] Controlled Substance Registry consulted? Not Applicable   Rolla Plate 09/03/17 1233

## 2017-09-03 NOTE — Discharge Instructions (Addendum)
°  Please return to urgent care in 2 days for packing removal and wound recheck.

## 2017-09-05 ENCOUNTER — Emergency Department (INDEPENDENT_AMBULATORY_CARE_PROVIDER_SITE_OTHER)
Admission: EM | Admit: 2017-09-05 | Discharge: 2017-09-05 | Disposition: A | Discharge status: Home / Self Care | Payer: 59 | Source: Home / Self Care

## 2017-09-05 DIAGNOSIS — Z5189 Encounter for other specified aftercare: Secondary | ICD-10-CM | POA: Diagnosis not present

## 2017-09-05 DIAGNOSIS — L02416 Cutaneous abscess of left lower limb: Secondary | ICD-10-CM

## 2017-09-05 NOTE — ED Provider Notes (Signed)
[facility]    CSN: [id number] Arrival date & time: 09/05/17  1557     History   Chief Complaint No chief complaint on file.   HPI Jimmy Mcknight is a 42 y.o. male.   HPI Patient is here for a recheck of the abscess he had behind his left knee in the popliteal fossa.  It is been I indeed last week and was slow healing.  It is been packed several times.  He has been continuing to work [employer]. No past medical history on file.  Patient Active Problem List   Diagnosis Date Noted  . HAIR LOSS 08/11/2009  . EPIGASTRIC PAIN 11/29/2006    No past surgical history on file.     Home Medications    Prior to Admission medications   Medication Sig Start Date End Date Taking? Authorizing Provider  doxycycline (VIBRAMYCIN) 100 MG capsule Take 1 capsule (100 mg total) by mouth 2 (two) times daily. 08/27/17   Collene Gobbleaub, Steven A, MD  traMADol (ULTRAM) 50 MG tablet 1 every 6 hours as needed for pain. 08/27/17   Collene Gobbleaub, Steven A, MD  traMADol (ULTRAM) 50 MG tablet Take 1 tablet (50 mg total) by mouth every 6 (six) hours as needed. 08/30/17   Collene Gobbleaub, Steven A, MD    Family History No family history on file.  Social History Social History   Tobacco Use  . Smoking status: Current Every Day Smoker  . Smokeless tobacco: Never Used  Substance Use Topics  . Alcohol use: Yes    Comment: OCC  . Drug use: Yes     Allergies   Tylenol [acetaminophen]   Review of Systems Review of Systems No additional complaints  Physical Exam Triage Vital Signs ED Triage Vitals  Enc Vitals Group     BP      Pulse      Resp      Temp      Temp src      SpO2      Weight      Height      Head Circumference      Peak Flow      Pain Score      Pain Loc      Pain Edu?      Excl. in GC?    No data found.  Updated Vital Signs There were no vitals taken for this visit.  Visual Acuity Right Eye Distance:   Left Eye Distance:   Bilateral Distance:    Right Eye Near:     Left Eye Near:    Bilateral Near:     Physical Exam Packing removed.  Wound still is nicely open about 3 mm to 4 mm in diameter, may be 3 to 4 mm deep.  Looks clean and had no pus, only old blood on the packing.  It is completely nontender.  No surrounding erythema.  UC Treatments / Results  Labs (all labs ordered are listed, but only abnormal results are displayed) Labs Reviewed - No data to display  EKG None  Radiology No results found.  Procedures Procedures (including critical care time)  Medications Ordered in UC Medications - No data to display  Initial Impression / Assessment and Plan / UC Course  I have reviewed the triage vital signs and the nursing notes.  Pertinent labs & imaging results that were available during my care of the patient were reviewed by me and considered in my medical decision making (see  chart for details).     Popliteal abscess almost resolved, does not need to be repacked today. Final Clinical Impressions(s) / UC Diagnoses   Final diagnoses:  Cutaneous abscess of left lower extremity  Wound check, abscess     Discharge Instructions     Keep wound clean and covered until fully healed.  Return if problems.    ED Prescriptions    None     Controlled Substance Prescriptions [place] Controlled Substance Registry consulted? No   Peyton Najjar, MD 09/05/17 [phone]

## 2017-09-05 NOTE — Discharge Instructions (Addendum)
Keep wound clean and covered until fully healed.  Return if problems.

## 2018-08-17 ENCOUNTER — Ambulatory Visit (INDEPENDENT_AMBULATORY_CARE_PROVIDER_SITE_OTHER): Payer: 59 | Admitting: Sports Medicine

## 2018-08-17 ENCOUNTER — Ambulatory Visit (INDEPENDENT_AMBULATORY_CARE_PROVIDER_SITE_OTHER): Payer: 59

## 2018-08-17 ENCOUNTER — Other Ambulatory Visit: Payer: Self-pay

## 2018-08-17 ENCOUNTER — Encounter: Payer: Self-pay | Admitting: Sports Medicine

## 2018-08-17 DIAGNOSIS — R1031 Right lower quadrant pain: Secondary | ICD-10-CM | POA: Insufficient documentation

## 2018-08-17 MED ORDER — MELOXICAM 15 MG PO TABS
ORAL_TABLET | ORAL | 3 refills | Status: DC
Start: 2018-08-17 — End: 2019-06-12

## 2018-08-17 NOTE — Assessment & Plan Note (Signed)
Likely hip flexor strain. No palpable hernias on exam. Adding meloxicam, x-rays of the hip and pelvis, lumbar spine. Hip flexor rehab exercises given, return to see me in 2 to 3 weeks and we can consider advanced imaging if no better.

## 2018-08-17 NOTE — Progress Notes (Signed)
Subjective:    CC: New patient visit with the below complaints as noted in HPI:  HPI:  This is a pleasant 43 year old male, couple weeks ago he was trying to lift a riding mower, he did not have immediate pain but as the day went on he started to feel pain in his right groin, with radiation from the back to the groin.  No scrotal or lower pelvic change in appearance, no bulges, no GI symptoms.  Pain is moderate, persistent, localized without radiation, worse with sitting, better with standing and laying flat.  I reviewed the past medical history, family history, social history, surgical history, and allergies today and no changes were needed.  Please see the problem list section below in epic for further details.  Past Medical History: No past medical history on file. Past Surgical History: No past surgical history on file. Social History: Social History   Socioeconomic History  . Marital status: Married    Spouse name: Not on file  . Number of children: Not on file  . Years of education: Not on file  . Highest education level: Not on file  Occupational History  . Not on file  Social Needs  . Financial resource strain: Not on file  . Food insecurity:    Worry: Not on file    Inability: Not on file  . Transportation needs:    Medical: Not on file    Non-medical: Not on file  Tobacco Use  . Smoking status: Current Every Day Smoker  . Smokeless tobacco: Never Used  Substance and Sexual Activity  . Alcohol use: Yes    Comment: OCC  . Drug use: Yes  . Sexual activity: Not on file  Lifestyle  . Physical activity:    Days per week: Not on file    Minutes per session: Not on file  . Stress: Not on file  Relationships  . Social connections:    Talks on phone: Not on file    Gets together: Not on file    Attends religious service: Not on file    Active member of club or organization: Not on file    Attends meetings of clubs or organizations: Not on file    Relationship  status: Not on file  Other Topics Concern  . Not on file  Social History Narrative  . Not on file   Family History: No family history on file. Allergies: Allergies  Allergen Reactions  . Tylenol [Acetaminophen]     Tylenol 3 upsets stomach   Medications: See med rec.  Review of Systems: No headache, visual changes, nausea, vomiting, diarrhea, constipation, dizziness, abdominal pain, skin rash, fevers, chills, night sweats, swollen lymph nodes, weight loss, chest pain, body aches, joint swelling, muscle aches, shortness of breath, mood changes, visual or auditory hallucinations.  Objective:    General: Well Developed, well nourished, and in no acute distress.  Neuro: Alert and oriented x3, extra-ocular muscles intact, sensation grossly intact.  HEENT: Normocephalic, atraumatic, pupils equal round reactive to light, neck supple, no masses, no lymphadenopathy, thyroid nonpalpable.  Skin: Warm and dry, no rashes noted.  Cardiac: Regular rate and rhythm, no murmurs rubs or gallops.  Respiratory: Clear to auscultation bilaterally. Not using accessory muscles, speaking in full sentences.  Abdominal: Soft, nontender, nondistended, positive bowel sounds, no masses, no organomegaly.  Genitourinary exam was unremarkable, no palpable impulses on Valsalva in the internal ring or anteriorly over the pelvis, scrotum is unremarkable and symmetric. Right hip: ROM IR: 60  Deg, ER: 60 Deg, Flexion: 120 Deg, Extension: 100 Deg, Abduction: 45 Deg, Adduction: 45 Deg Strength IR: 5/5, ER: 5/5, Flexion: 5/5, Extension: 5/5, Abduction: 5/5, Adduction: 5/5 Reproduction of pain with resisted flexion of the right hip. Pelvic alignment unremarkable to inspection and palpation. Standing hip rotation and gait without trendelenburg / unsteadiness. Greater trochanter without tenderness to palpation. No tenderness over piriformis. No SI joint tenderness and normal minimal SI movement.  Impression and  Recommendations:    The patient was counselled, risk factors were discussed, anticipatory guidance given.  Right groin pain Likely hip flexor strain. No palpable hernias on exam. Adding meloxicam, x-rays of the hip and pelvis, lumbar spine. Hip flexor rehab exercises given, return to see me in 2 to 3 weeks and we can consider advanced imaging if no better.   ___________________________________________ Ihor Austinhomas J. Benjamin Stainhekkekandam, M.D., ABFM., CAQSM. Primary Care and Sports Medicine  [facility]  Adjunct Professor of Family Medicine  [employer]

## 2018-09-08 ENCOUNTER — Other Ambulatory Visit: Payer: Self-pay

## 2018-09-08 ENCOUNTER — Emergency Department (HOSPITAL_COMMUNITY): Payer: 59

## 2018-09-08 ENCOUNTER — Emergency Department (HOSPITAL_COMMUNITY)
Admission: EM | Admit: 2018-09-08 | Discharge: 2018-09-09 | Disposition: A | Discharge status: Home / Self Care | Payer: 59 | Attending: Emergency Medicine | Admitting: Emergency Medicine

## 2018-09-08 ENCOUNTER — Encounter (HOSPITAL_COMMUNITY): Payer: Self-pay | Admitting: *Deleted

## 2018-09-08 DIAGNOSIS — S022XXB Fracture of nasal bones, initial encounter for open fracture: Secondary | ICD-10-CM | POA: Diagnosis not present

## 2018-09-08 DIAGNOSIS — S0083XA Contusion of other part of head, initial encounter: Secondary | ICD-10-CM | POA: Insufficient documentation

## 2018-09-08 DIAGNOSIS — R600 Localized edema: Secondary | ICD-10-CM | POA: Insufficient documentation

## 2018-09-08 DIAGNOSIS — R456 Violent behavior: Secondary | ICD-10-CM | POA: Insufficient documentation

## 2018-09-08 DIAGNOSIS — Y929 Unspecified place or not applicable: Secondary | ICD-10-CM | POA: Diagnosis not present

## 2018-09-08 DIAGNOSIS — Y908 Blood alcohol level of 240 mg/100 ml or more: Secondary | ICD-10-CM | POA: Insufficient documentation

## 2018-09-08 DIAGNOSIS — Y939 Activity, unspecified: Secondary | ICD-10-CM | POA: Diagnosis not present

## 2018-09-08 DIAGNOSIS — Y999 Unspecified external cause status: Secondary | ICD-10-CM | POA: Diagnosis not present

## 2018-09-08 DIAGNOSIS — F10920 Alcohol use, unspecified with intoxication, uncomplicated: Secondary | ICD-10-CM | POA: Insufficient documentation

## 2018-09-08 DIAGNOSIS — S0993XA Unspecified injury of face, initial encounter: Secondary | ICD-10-CM | POA: Diagnosis present

## 2018-09-08 DIAGNOSIS — L559 Sunburn, unspecified: Secondary | ICD-10-CM | POA: Insufficient documentation

## 2018-09-08 DIAGNOSIS — F17200 Nicotine dependence, unspecified, uncomplicated: Secondary | ICD-10-CM | POA: Diagnosis not present

## 2018-09-08 LAB — CBC WITH DIFFERENTIAL/PLATELET
Abs Immature Granulocytes: 0.01 10*3/uL (ref 0.00–0.07)
Basophils Absolute: 0.1 10*3/uL (ref 0.0–0.1)
Basophils Relative: 1 %
Eosinophils Absolute: 0.5 10*3/uL (ref 0.0–0.5)
Eosinophils Relative: 8 %
HCT: 42.5 % (ref 39.0–52.0)
Hemoglobin: 14.4 g/dL (ref 13.0–17.0)
Immature Granulocytes: 0 %
Lymphocytes Relative: 22 %
Lymphs Abs: 1.4 10*3/uL (ref 0.7–4.0)
MCH: 30.3 pg (ref 26.0–34.0)
MCHC: 33.9 g/dL (ref 30.0–36.0)
MCV: 89.3 fL (ref 80.0–100.0)
Monocytes Absolute: 0.4 10*3/uL (ref 0.1–1.0)
Monocytes Relative: 6 %
Neutro Abs: 3.8 10*3/uL (ref 1.7–7.7)
Neutrophils Relative %: 63 %
Platelets: 247 10*3/uL (ref 150–400)
RBC: 4.76 MIL/uL (ref 4.22–5.81)
RDW: 12.1 % (ref 11.5–15.5)
WBC: 6.2 10*3/uL (ref 4.0–10.5)
nRBC: 0 % (ref 0.0–0.2)

## 2018-09-08 MED ORDER — TETANUS-DIPHTH-ACELL PERTUSSIS 5-2.5-18.5 LF-MCG/0.5 IM SUSP
0.5000 mL | Freq: Once | INTRAMUSCULAR | Status: DC
  Filled 2018-09-08: qty 0.5

## 2018-09-08 NOTE — ED Notes (Signed)
Pt to CT via stretcher

## 2018-09-08 NOTE — ED Notes (Signed)
Pt's wife Clearnce Camisa  [phone] updated on status and we can call her for ride when discharged.
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
## 2018-09-08 NOTE — Consult Note (Signed)
Responded to Lvl 2 page, pt unavailable, no family present. Staff will page if further need of chaplain services.  Rev. Donnel Saxon Chaplain

## 2018-09-09 LAB — URINALYSIS, ROUTINE W REFLEX MICROSCOPIC
Bacteria, UA: NONE SEEN
Bilirubin Urine: NEGATIVE
Glucose, UA: NEGATIVE mg/dL
Ketones, ur: NEGATIVE mg/dL
Leukocytes,Ua: NEGATIVE
Nitrite: NEGATIVE
Protein, ur: NEGATIVE mg/dL
Specific Gravity, Urine: 1.008 (ref 1.005–1.030)
pH: 5 (ref 5.0–8.0)

## 2018-09-09 LAB — COMPREHENSIVE METABOLIC PANEL
ALT: 40 U/L (ref 0–44)
AST: 31 U/L (ref 15–41)
Albumin: 4.3 g/dL (ref 3.5–5.0)
Alkaline Phosphatase: 71 U/L (ref 38–126)
Anion gap: 12 (ref 5–15)
BUN: 10 mg/dL (ref 6–20)
CO2: 21 mmol/L — ABNORMAL LOW (ref 22–32)
Calcium: 9.2 mg/dL (ref 8.9–10.3)
Chloride: 104 mmol/L (ref 98–111)
Creatinine, Ser: 0.88 mg/dL (ref 0.61–1.24)
GFR calc Af Amer: >60 mL/min (ref >60)
GFR calc non Af Amer: >60 mL/min (ref >60)
Glucose, Bld: 110 mg/dL — ABNORMAL HIGH (ref 70–99)
Potassium: 3.8 mmol/L (ref 3.5–5.1)
Sodium: 137 mmol/L (ref 135–145)
Total Bilirubin: 0.9 mg/dL (ref 0.3–1.2)
Total Protein: 7.2 g/dL (ref 6.5–8.1)

## 2018-09-09 LAB — ETHANOL: Alcohol, Ethyl (B): 324 mg/dL (ref <10)

## 2018-09-09 MED ORDER — HALOPERIDOL LACTATE 5 MG/ML IJ SOLN
5.0000 mg | Freq: Once | INTRAMUSCULAR | Status: AC
  Administered 2018-09-09: 5 mg via INTRAMUSCULAR

## 2018-09-09 MED ORDER — LORAZEPAM 2 MG/ML IJ SOLN
2.0000 mg | Freq: Once | INTRAMUSCULAR | Status: AC
  Administered 2018-09-09: 2 mg via INTRAMUSCULAR

## 2018-09-09 MED ORDER — CEPHALEXIN 500 MG PO CAPS: 500.0000 mg | ORAL_CAPSULE | Freq: Three times a day (TID) | ORAL | 0 refills | Status: DC

## 2018-09-09 MED ORDER — LORAZEPAM 2 MG/ML IJ SOLN
1.0000 mg | Freq: Once | INTRAMUSCULAR | Status: DC
  Filled 2018-09-09: qty 1

## 2018-09-09 MED ORDER — HALOPERIDOL LACTATE 5 MG/ML IJ SOLN
2.5000 mg | Freq: Once | INTRAMUSCULAR | Status: DC
  Filled 2018-09-09: qty 1

## 2018-09-09 MED ORDER — IOHEXOL 300 MG/ML  SOLN
100.0000 mL | Freq: Once | INTRAMUSCULAR | Status: AC | PRN
  Administered 2018-09-08: 100 mL via INTRAVENOUS

## 2018-09-09 NOTE — ED Notes (Signed)
Brianne RN spoke with patients wife Starla Link, she will pick up patient ASAP (30-60min)

## 2018-09-09 NOTE — ED Notes (Signed)
RN attempted x5 to get Pt to drink liquids as well as get out of bed to ambulate. Pt refuses to drink fluids and ambulate x5
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
## 2018-09-09 NOTE — ED Notes (Signed)
Pts wife updated on restraints and monitoring period.
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
## 2018-09-09 NOTE — ED Notes (Signed)
RN informed by Pt's MD to discontinue Violent restricts.

## 2018-09-09 NOTE — ED Notes (Signed)
Pt tolerated ambulating fairly. Pt tolerated fluids

## 2018-09-09 NOTE — ED Notes (Signed)
CT tech request assistance, pt combative, pt has removed all monitoring devices and IV, pt throwing items and threatening staff and [employer].  Pt refusing to allow to be placed back on CCM.
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
## 2018-09-11 ENCOUNTER — Encounter: Payer: Self-pay | Admitting: Sports Medicine

## 2019-06-12 ENCOUNTER — Encounter: Payer: Self-pay | Admitting: Sports Medicine

## 2019-06-12 ENCOUNTER — Other Ambulatory Visit: Payer: Self-pay

## 2019-06-12 ENCOUNTER — Ambulatory Visit (INDEPENDENT_AMBULATORY_CARE_PROVIDER_SITE_OTHER): Payer: 59 | Admitting: Sports Medicine

## 2019-06-12 DIAGNOSIS — M5136 Other intervertebral disc degeneration, lumbar region: Secondary | ICD-10-CM

## 2019-06-12 DIAGNOSIS — M51369 Other intervertebral disc degeneration, lumbar region without mention of lumbar back pain or lower extremity pain: Secondary | ICD-10-CM | POA: Insufficient documentation

## 2019-06-12 MED ORDER — MELOXICAM 15 MG PO TABS: ORAL_TABLET | ORAL | 3 refills | Status: DC

## 2019-06-12 MED ORDER — PREDNISONE 50 MG PO TABS
ORAL_TABLET | ORAL | 0 refills | Status: DC
Start: 2019-06-12 — End: 2019-07-24

## 2019-06-12 NOTE — Progress Notes (Signed)
    Procedures performed today:    None.  Independent interpretation of notes and tests performed by another provider:   On personal review of his CT abdomen and pelvis from last summer he has a small to moderate disc protrusion at the L4-L5 level.  Impression and Recommendations:    Lumbar degenerative disc disease Jimmy Mcknight returns, he had pain in the right side of his low back since last summer. It is axial, nothing radicular, no red flags. On personal review of his CT abdomen and pelvis from last summer he has a small to moderate disc protrusion at the L4-L5 level. This is likely the cause of his pain, adding 5 days of prednisone, meloxicam, formal physical therapy. Return to see me in 4 to 6 weeks, MRI for interventional planning if no better.    ___________________________________________ Ihor Austin. Benjamin Stain, M.D., ABFM., CAQSM. Primary Care and Sports Medicine [facility]  Adjunct Instructor of Family Medicine  [employer]

## 2019-06-12 NOTE — Assessment & Plan Note (Signed)
Keshav returns, he had pain in the right side of his low back since last summer. It is axial, nothing radicular, no red flags. On personal review of his CT abdomen and pelvis from last summer he has a small to moderate disc protrusion at the L4-L5 level. This is likely the cause of his pain, adding 5 days of prednisone, meloxicam, formal physical therapy. Return to see me in 4 to 6 weeks, MRI for interventional planning if no better.

## 2019-06-21 ENCOUNTER — Other Ambulatory Visit: Payer: Self-pay

## 2019-06-21 ENCOUNTER — Ambulatory Visit (INDEPENDENT_AMBULATORY_CARE_PROVIDER_SITE_OTHER): Payer: 59 | Admitting: Rehabilitative and Restorative Service Providers"

## 2019-06-21 ENCOUNTER — Encounter: Payer: Self-pay | Admitting: Rehabilitative and Restorative Service Providers"

## 2019-06-21 DIAGNOSIS — M545 Low back pain: Secondary | ICD-10-CM

## 2019-06-21 DIAGNOSIS — G8929 Other chronic pain: Secondary | ICD-10-CM | POA: Diagnosis not present

## 2019-06-21 DIAGNOSIS — R29898 Other symptoms and signs involving the musculoskeletal system: Secondary | ICD-10-CM

## 2019-06-21 NOTE — Therapy (Signed)
[facility] [address] [place], [place], [address] Phone: [phone]   Fax:  [phone]  Physical Therapy Evaluation  Patient Details  Name: Jimmy Mcknight MRN: [id number] Date of Birth: 07-09-1975 Referring Provider (PT): Dr Benjamin Stain    Encounter Date: 06/21/2019  PT End of Session - 06/21/19 1655    Visit Number  1    Number of Visits  12    Date for PT Re-Evaluation  08/02/19    PT Start Time  1445    PT Stop Time  1544    PT Time Calculation (min)  59 min    Activity Tolerance  Patient tolerated treatment well       History reviewed. No pertinent past medical history.  History reviewed. No pertinent surgical history.  There were no vitals filed for this visit.   Subjective Assessment - 06/21/19 1537    Subjective  Patient reports that he has pain in the Rt LB since 7/20 following irritation/injury lifting a lawn mower. Symptoms have persisted and have gotten worse in the past few weeks or months.    Pertinent History  ankle surgery ~ 8 yrs ago for torn tendons    Patient Stated Goals  get rid of back pain    Currently in Pain?  Yes    Pain Score  2     Pain Location  Back    Pain Orientation  Right    Pain Descriptors / Indicators  Sharp    Pain Type  Chronic pain    Pain Onset  More than a month ago    Pain Frequency  Constant   last 1-2 weeks   Aggravating Factors   stooping; bending a certain way; getting up off the couch; sometimes getting out of bed; lifting    Pain Relieving Factors  meds have helped some         [facility] PT Assessment - 06/21/19 0001      Assessment   Medical Diagnosis  Rt sided LBP     Referring Provider (PT)  Dr Benjamin Stain     Onset Date/Surgical Date  10/18/19    Hand Dominance  Right    Next MD Visit  07/24/19    Prior Therapy  none       Precautions   Precautions  None      Balance Screen   Has the patient fallen in the past 6 months  No    Has the patient  had a decrease in activity level because of a fear of falling?   No    Is the patient reluctant to leave their home because of a fear of falling?   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 70 # bent forward much of the day lifting floor to waist height lifting 5-10 times in an hour 8 hours/day     Leisure  yard work; sedentary; boating; tubing       Observation/Other Assessments   Focus on Therapeutic Outcomes (FOTO)   20% limitation       Sensation   Additional Comments  WFL's per pt report       Posture/Postural Control   Posture Comments  rounded shoulders in  sitting and standing; sits with rounded lumbar spine       AROM   Lumbar Flexion  70% pain in the Rt LB    Lumbar Extension  20% pain in the Rt LB    Lumbar - Right Side Bend  75% pain in the Rt LB    Lumbar - Left Side Bend  70% pulling Rt LB no pain     Lumbar - Right Rotation  70% no pain     Lumbar - Left Rotation  60% no pain       Strength   Overall Strength Comments  WFL's bilat LEs       Flexibility   Hamstrings  mild tightness Rt    Quadriceps  WFL's bilat     ITB  mild tightness Rt     Piriformis  tight Rt       Palpation   Spinal mobility  WFL's PA and lateral spring testing     Palpation comment  muscular tightness Rt lumbar paraspinals and QL/lats                 Objective measurements completed on examination: See above findings.      [facility] Adult PT Treatment/Exercise - 06/21/19 0001      Therapeutic Activites    Therapeutic Activities  --   hinged hip sit to stand and lifting techniques      Neuro Re-ed    Neuro Re-ed Details   postural correction - core work       Lumbar Exercises: Stretches   Double Knee to Chest Stretch  3 reps;20 seconds    Piriformis Stretch  Right;2 reps;20  seconds;30 seconds   supine piriformis    Piriformis Stretch Limitations  reproduced familiar pain in the Rt LB       Moist Heat Therapy   Number Minutes Moist Heat  14 Minutes    Moist Heat Location  Lumbar Spine      Electrical Stimulation   Electrical Stimulation Location  Rt lumbar/QL    Electrical Stimulation Action  IFC    Electrical Stimulation Parameters  to tolerance    Electrical Stimulation Goals  Pain;Tone             PT Education - 06/21/19 1615    Education Details  POC HEP DN posture    Person(s) Educated  Patient    Methods  Explanation;Demonstration;Tactile cues;Verbal cues;Handout    Comprehension  Verbalized understanding;Returned demonstration;Verbal cues required;Tactile cues required          PT Long Term Goals - 06/21/19 1702      PT LONG TERM GOAL #1   Title  Decrease pain Rt LB by 50-75% allowing patient to return to all normal functional and work activities without pain    Time  6    Period  Weeks    Status  New    Target Date  08/02/19      PT LONG TERM GOAL #2   Title  Restore normal trunk ROM without pain    Time  6    Period  Weeks    Status  New    Target Date  08/02/19      PT LONG TERM GOAL #3   Title  Patient to demonstrate good lifting techniques with hinged hip and core engaged    Time  2    Period  Weeks    Status  New      PT LONG TERM GOAL #  4   Title  Independent in HEP    Time  6    Period  Weeks    Status  New    Target Date  08/02/19      PT LONG TERM GOAL #5   Title  Improve FOTO to </= 17% limitation    Time  6    Period  Weeks    Status  New    Target Date  08/02/19             Plan - 06/21/19 1656    Clinical Impression Statement  Patient presents with chronic Rt LBP which has increased in the past few weeks. He remembers irritating the LB lifting a lawn mower in July 2020. Patient has poor posture and alignment; limited trunk mobility; muscular tightness through the Rt lumbar paraspinals and  QL; poor body mechanics; pain on a daily basis. Patient will benefit from PT to address problems identified.    Stability/Clinical Decision Making  Stable/Uncomplicated    Clinical Decision Making  Low    Rehab Potential  Good    PT Frequency  2x / week    PT Duration  6 weeks    PT Treatment/Interventions  ADLs/Self Care Home Management;Patient/family education;Cryotherapy;Electrical Stimulation;Iontophoresis 4mg /ml Dexamethasone;Moist Heat;Ultrasound;Functional mobility training;Therapeutic activities;Therapeutic exercise;Neuromuscular re-education;Manual techniques;Dry needling;Taping    PT Next Visit Plan  review HEP; trial of stretch for lats/QL; add deep tissue work vs DN Rt QL/lumbar paraspinals; back care education; core strengthening; modalities as indicated(trial of estim - has TENS at home)    PT Home Exercise Plan  VHI    Consulted and Agree with Plan of Care  Patient       Patient will benefit from skilled therapeutic intervention in order to improve the following deficits and impairments:     Visit Diagnosis: Chronic right-sided low back pain without sciatica - Plan: PT plan of care cert/re-cert  Other symptoms and signs involving the musculoskeletal system - Plan: PT plan of care cert/re-cert     Problem List Patient Active Problem List   Diagnosis Date Noted  . Lumbar degenerative disc disease 06/12/2019  . Right groin pain 08/17/2018  . HAIR LOSS 08/11/2009  . EPIGASTRIC PAIN 11/29/2006    Ronnel Zuercher 12/01/2006 PT, MPH  06/21/2019, 5:07 PM  [facility] [address] [place], [place], [place] Phone: [phone]   Fax:  [phone]  Name: LEMARCUS BAGGERLY MRN: [place] Date of Birth: [birth year]

## 2019-06-21 NOTE — Patient Instructions (Signed)
Piriformis Stretch    Lying on back, pull right knee toward opposite shoulder. Hold _30___ seconds. Repeat __3__ times. Do __2-3__ sessions per day.   Double Knee to Chest (Flexion)    Gently pull both knees toward chest. Feel stretch in lower back or buttock area. Breathing deeply, Hold __20__ seconds. Repeat __3__ times. Do __2-3__ sessions per day.   Trigger Point Dry Needling  . What is Trigger Point Dry Needling (DN)? o DN is a physical therapy technique used to treat muscle pain and dysfunction. Specifically, DN helps deactivate muscle trigger points (muscle knots).  o A thin filiform needle is used to penetrate the skin and stimulate the underlying trigger point. The goal is for a local twitch response (LTR) to occur and for the trigger point to relax. No medication of any kind is injected during the procedure.   . What Does Trigger Point Dry Needling Feel Like?  o The procedure feels different for each individual patient. Some patients report that they do not actually feel the needle enter the skin and overall the process is not painful. Very mild bleeding may occur. However, many patients feel a deep cramping in the muscle in which the needle was inserted. This is the local twitch response.   Marland Kitchen How Will I feel after the treatment? o Soreness is normal, and the onset of soreness may not occur for a few hours. Typically this soreness does not last longer than two days.  o Bruising is uncommon, however; ice can be used to decrease any possible bruising.  o In rare cases feeling tired or nauseous after the treatment is normal. In addition, your symptoms may get worse before they get better, this period will typically not last longer than 24 hours.   . What Can I do After My Treatment? o Increase your hydration by drinking more water for the next 24 hours. o You may place ice or heat on the areas treated that have become sore, however, do not use heat on inflamed or bruised areas.  Heat often brings more relief post needling. o You can continue your regular activities, but vigorous activity is not recommended initially after the treatment for 24 hours. o DN is best combined with other physical therapy such as strengthening, stretching, and other therapies.     Sleeping on Back  Place pillow under knees. A pillow with cervical support and a roll around waist are also helpful. Copyright  VHI. All rights reserved.  Sleeping on Side Place pillow between knees. Use cervical support under neck and a roll around waist as needed. Copyright  VHI. All rights reserved.   Sleeping on Stomach   If this is the only desirable sleeping position, place pillow under lower legs, and under stomach or chest as needed.  Posture - Sitting   Sit upright, head facing forward. Try using a roll to support lower back. Keep shoulders relaxed, and avoid rounded back. Keep hips level with knees. Avoid crossing legs for long periods. Stand to Sit / Sit to Stand   To sit: Bend knees to lower self onto front edge of chair, then scoot back on seat. To stand: Reverse sequence by placing one foot forward, and scoot to front of seat. Use rocking motion to stand up.   Work Height and Reach  Ideal work height is no more than 2 to 4 inches below elbow level when standing, and at elbow level when sitting. Reaching should be limited to arm's length, with elbows slightly  bent.  Bending  Bend at hips and knees, not back. Keep feet shoulder-width apart.    Posture - Standing   Good posture is important. Avoid slouching and forward head thrust. Maintain curve in low back and align ears over shoul- ders, hips over ankles.  Alternating Positions   Alternate tasks and change positions frequently to reduce fatigue and muscle tension. Take rest breaks. Computer Work   Position work to Programmer, multimedia. Use proper work and seat height. Keep shoulders back and down, wrists straight, and elbows at  right angles. Use chair that provides full back support. Add footrest and lumbar roll as needed.  Getting Into / Out of Car  Lower self onto seat, scoot back, then bring in one leg at a time. Reverse sequence to get out.  Dressing  Lie on back to pull socks or slacks over feet, or sit and bend leg while keeping back straight.    Housework - Sink  Place one foot on ledge of cabinet under sink when standing at sink for prolonged periods.   Pushing / Pulling  Pushing is preferable to pulling. Keep back in proper alignment, and use leg muscles to do the work.  Deep Squat   Squat and lift with both arms held against upper trunk. Tighten stomach muscles without holding breath. Use smooth movements to avoid jerking.  Avoid Twisting   Avoid twisting or bending back. Pivot around using foot movements, and bend at knees if needed when reaching for articles.  Carrying Luggage   Distribute weight evenly on both sides. Use a cart whenever possible. Do not twist trunk. Move body as a unit.   Lifting Principles .Maintain proper posture and head alignment. .Slide object as close as possible before lifting. .Move obstacles out of the way. .Test before lifting; ask for help if too heavy. .Tighten stomach muscles without holding breath. .Use smooth movements; do not jerk. .Use legs to do the work, and pivot with feet. .Distribute the work load symmetrically and close to the center of trunk. .Push instead of pull whenever possible.   Ask For Help   Ask for help and delegate to others when possible. Coordinate your movements when lifting together, and maintain the low back curve.  Log Roll   Lying on back, bend left knee and place left arm across chest. Roll all in one movement to the right. Reverse to roll to the left. Always move as one unit. Housework - Sweeping  Use long-handled equipment to avoid stooping.   Housework - Wiping  Position yourself as close as possible to  reach work surface. Avoid straining your back.  Laundry - Unloading Wash   To unload small items at bottom of washer, lift leg opposite to arm being used to reach.  [place] close to area to be raked. Use arm movements to do the work. Keep back straight and avoid twisting.     Cart  When reaching into cart with one arm, lift opposite leg to keep back straight.   Getting Into / Out of Bed  Lower self to lie down on one side by raising legs and lowering head at the same time. Use arms to assist moving without twisting. Bend both knees to roll onto back if desired. To sit up, start from lying on side, and use same move-ments in reverse. Housework - Vacuuming  Hold the vacuum with arm held at side. Step back and forth to move it, keeping head up. Avoid twisting.  Laundry - Armed forces training and education officer so that bending and twisting can be avoided.   Laundry - Unloading Dryer  Squat down to reach into clothes dryer or use a reacher.  Gardening - Weeding / Psychiatric nurse or Kneel. Knee pads may be helpful.

## 2019-06-25 ENCOUNTER — Other Ambulatory Visit: Payer: Self-pay

## 2019-06-25 ENCOUNTER — Encounter: Payer: Self-pay | Admitting: Physical Therapy

## 2019-06-25 ENCOUNTER — Ambulatory Visit (INDEPENDENT_AMBULATORY_CARE_PROVIDER_SITE_OTHER): Payer: 59 | Admitting: Physical Therapy

## 2019-06-25 DIAGNOSIS — G8929 Other chronic pain: Secondary | ICD-10-CM

## 2019-06-25 DIAGNOSIS — M545 Low back pain: Secondary | ICD-10-CM | POA: Diagnosis not present

## 2019-06-25 DIAGNOSIS — R29898 Other symptoms and signs involving the musculoskeletal system: Secondary | ICD-10-CM

## 2019-06-25 NOTE — Therapy (Signed)
[facility] [address] [place], [place], [address] Phone: [phone]   Fax:  [phone]  Physical Therapy Treatment  Patient Details  Name: Jimmy Mcknight MRN: [id number] Date of Birth: 1975-04-11 Referring Provider (PT): Dr Benjamin Stain    Encounter Date: 06/25/2019  PT End of Session - 06/25/19 1604    Visit Number  2    Number of Visits  12    Date for PT Re-Evaluation  08/02/19    PT Start Time  1604    PT Stop Time  1645    PT Time Calculation (min)  41 min    Activity Tolerance  Patient tolerated treatment well;No increased pain       History reviewed. No pertinent past medical history.  History reviewed. No pertinent surgical history.  There were no vitals filed for this visit.  Subjective Assessment - 06/25/19 1609    Subjective  Pt reports his back feels pretty good now; was a 2/10 in Rt low back when he woke up this morning.  He has tried to do the stretches given, but not every day.    Patient Stated Goals  get rid of back pain    Currently in Pain?  Yes    Pain Score  0-No pain    Pain Location  Back    Pain Orientation  Right         [facility] PT Assessment - 06/25/19 0001      Assessment   Medical Diagnosis  Rt sided LBP     Referring Provider (PT)  Dr Benjamin Stain     Onset Date/Surgical Date  10/18/19    Hand Dominance  Right    Next MD Visit  07/24/19    Prior Therapy  none        [facility] Adult PT Treatment/Exercise - 06/25/19 0001      Self-Care   Self-Care  Other Self-Care Comments    Other Self-Care Comments   pt educated on self massage with ball to lumbar musculature; pt returned demo with cues.       Lumbar Exercises: Stretches   Passive Hamstring Stretch  Right;3 reps;Left;1 rep;30 seconds   supine with strap   Prone on Elbows Stretch  5 reps   5-10 sec hold.    ITB Stretch  Right;2 reps;20 seconds   supine, with strap   Piriformis Stretch  Right;2 reps;Left;30 seconds    hooklying, knee to opp shoulder      Lumbar Exercises: Aerobic   Nustep  L5: legs only, for warm up 4 min       Lumbar Exercises: Supine   Ab Set  5 reps;5 seconds    AB Set Limitations  tactile cues required    Bent Knee Raise  10 reps   with ab set    Bridge  10 reps   with ab set     Lumbar Exercises: Quadruped   Madcat/Old Horse  5 reps    Madcat/Old Horse Limitations  tactile cues for form       Manual Therapy   Manual Therapy  Soft tissue mobilization    Manual therapy comments  pt in prone    Soft tissue mobilization  IASTM and STM to bilat lumbar paraspinals to decrease fascial restriction.       Ab set with sit to/from stand x 2 reps   PT Long Term Goals - 06/21/19 1702      PT LONG TERM GOAL #1  Title  Decrease pain Rt LB by 50-75% allowing patient to return to all normal functional and work activities without pain    Time  6    Period  Weeks    Status  New    Target Date  08/02/19      PT LONG TERM GOAL #2   Title  Restore normal trunk ROM without pain    Time  6    Period  Weeks    Status  New    Target Date  08/02/19      PT LONG TERM GOAL #3   Title  Patient to demonstrate good lifting techniques with hinged hip and core engaged    Time  2    Period  Weeks    Status  New      PT LONG TERM GOAL #4   Title  Independent in HEP    Time  6    Period  Weeks    Status  New    Target Date  08/02/19      PT LONG TERM GOAL #5   Title  Improve FOTO to </= 17% limitation    Time  6    Period  Weeks    Status  New    Target Date  08/02/19            Plan - 06/25/19 1654    Clinical Impression Statement  Pt reported some discomfort in Rt low back with prone on elbows stretch, otherwise all other exercises tolerated well.  Palpable tightness in Rt lumbar paraspinals; may benefit from STM to this area.  Back care education initiated.  Goals are ongoing.    Stability/Clinical Decision Making  Stable/Uncomplicated    Rehab Potential  Good    PT  Frequency  2x / week    PT Duration  6 weeks    PT Treatment/Interventions  ADLs/Self Care Home Management;Patient/family education;Cryotherapy;Electrical Stimulation;Iontophoresis 4mg /ml Dexamethasone;Moist Heat;Ultrasound;Functional mobility training;Therapeutic activities;Therapeutic exercise;Neuromuscular re-education;Manual techniques;Dry needling;Taping    PT Next Visit Plan  trial of stretch for lats/QL; add deep tissue work vs DN Rt QL/lumbar paraspinals; continue core strengthening with functional activities.    PT Home Exercise Plan  VHI, [id number]    Consulted and Agree with Plan of Care  Patient       Patient will benefit from skilled therapeutic intervention in order to improve the following deficits and impairments:     Visit Diagnosis: Chronic right-sided low back pain without sciatica  Other symptoms and signs involving the musculoskeletal system     Problem List Patient Active Problem List   Diagnosis Date Noted  . Lumbar degenerative disc disease 06/12/2019  . Right groin pain 08/17/2018  . HAIR LOSS 08/11/2009  . EPIGASTRIC PAIN 11/29/2006   Kerin Perna, PTA 06/25/19 5:01 PM  [place], [place], [address] Phone: [phone]   Fax:  [phone]  Name: Jimmy Mcknight MRN: [id number] Date of Birth: 1975-07-06

## 2019-06-28 ENCOUNTER — Encounter: Payer: Self-pay | Admitting: Physical Therapy

## 2019-06-28 ENCOUNTER — Other Ambulatory Visit: Payer: Self-pay

## 2019-06-28 ENCOUNTER — Ambulatory Visit (INDEPENDENT_AMBULATORY_CARE_PROVIDER_SITE_OTHER): Payer: 59 | Admitting: Physical Therapy

## 2019-06-28 DIAGNOSIS — M545 Low back pain, unspecified: Secondary | ICD-10-CM

## 2019-06-28 DIAGNOSIS — G8929 Other chronic pain: Secondary | ICD-10-CM | POA: Diagnosis not present

## 2019-06-28 DIAGNOSIS — R29898 Other symptoms and signs involving the musculoskeletal system: Secondary | ICD-10-CM

## 2019-06-28 NOTE — Therapy (Signed)
[place], [place], [address] Phone: [phone]   Fax:  [phone]  Physical Therapy Treatment  Patient Details  Name: Jimmy Mcknight MRN: [id number] Date of Birth: 26-Jul-1975 Referring Provider (PT): Dr Dianah Field    Encounter Date: 06/28/2019  PT End of Session - 06/28/19 1645    Visit Number  3    Number of Visits  12    Date for PT Re-Evaluation  08/02/19    PT Start Time  8101    PT Stop Time  7510    PT Time Calculation (min)  41 min    Activity Tolerance  Patient tolerated treatment well;No increased pain       History reviewed. No pertinent past medical history.  History reviewed. No pertinent surgical history.  There were no vitals filed for this visit.  Subjective Assessment - 06/28/19 1610    Subjective  Pt has been painfree for last 2 days.  He has had stiffness in the morning, but no pain.  He has been doing the exercises before work in the mornings.    Patient Stated Goals  get rid of back pain    Currently in Pain?  No/denies    Pain Score  0-No pain         [facility] PT Assessment - 06/28/19 0001      Assessment   Medical Diagnosis  Rt sided LBP     Referring Provider (PT)  Dr Dianah Field     Onset Date/Surgical Date  10/18/19    Hand Dominance  Right    Next MD Visit  07/24/19    Prior Therapy  none       AROM   Lumbar Flexion  WNL    Lumbar Extension  40%, pain in the Rt LB    Lumbar - Right Side Bend  WNL - pain in R LB at end range    Lumbar - Left Side Bend  WNL    Lumbar - Right Rotation  WNL    Lumbar - Left Rotation  80% no pain.        [facility] Adult PT Treatment/Exercise - 06/28/19 0001      Lumbar Exercises: Stretches   Passive Hamstring Stretch  Right;Left;2 reps;30 seconds    Passive Hamstring Stretch Limitations  some discomfort behind Rt knee with knee straight; supine with strap, repeated after STM, no discomfort   Double Knee to Chest Stretch   1 rep;20 seconds    ITB Stretch  Right;2 reps;20 seconds   supine, with strap   Piriformis Stretch  Right;2 reps;Left;30 seconds   hooklying, knee to opp shoulder      Lumbar Exercises: Aerobic   Elliptical  L1: 3 min       Lumbar Exercises: Standing   Wall Slides  10 reps;3 seconds   with 5# in/out from core.    Other Standing Lumbar Exercises  anti-rotation lateral step outs with green band x 8 reps each side.       Manual Therapy   Manual Therapy  Soft tissue mobilization    Soft tissue mobilization  TPR to Rt paraspinals and QL, IASTM and STM to Rt hamstring to decrease fascial restrictions and pain.                   PT Long Term Goals - 06/28/19 1758      PT LONG TERM GOAL #1   Title  Decrease pain Rt LB  by 50-75% allowing patient to return to all normal functional and work activities without pain    Time  6    Period  Weeks    Status  On-going      PT LONG TERM GOAL #2   Title  Restore normal trunk ROM without pain    Time  6    Period  Weeks    Status  Partially Met      PT LONG TERM GOAL #3   Title  Patient to demonstrate good lifting techniques with hinged hip and core engaged    Time  2    Period  Weeks    Status  On-going      PT LONG TERM GOAL #4   Title  Independent in HEP    Time  6    Period  Weeks    Status  On-going      PT LONG TERM GOAL #5   Title  Improve FOTO to </= 17% limitation    Time  6    Period  Weeks    Status  On-going            Plan - 06/28/19 1654    Clinical Impression Statement  Pt's lumbar ROM has improved; able to touch toes and side bend without pain, rotation now full.  Pt continues with pain in Rt low back with lumbar ext and end range Rt side bending.  Pt had some burning in Rt lateral hamstring at knee with HS stretch; improved after STM and IASTM.  He tolerated core exercises well, without production of symptoms.  Progressing well towards LTGs.    Stability/Clinical Decision Making   Stable/Uncomplicated    Rehab Potential  Good    PT Frequency  2x / week    PT Duration  6 weeks    PT Treatment/Interventions  ADLs/Self Care Home Management;Patient/family education;Cryotherapy;Electrical Stimulation;Iontophoresis '4mg'$ /ml Dexamethasone;Moist Heat;Ultrasound;Functional mobility training;Therapeutic activities;Therapeutic exercise;Neuromuscular re-education;Manual techniques;Dry needling;Taping    PT Next Visit Plan  spinal mobs, manual to Rt QL/lumbar paraspinals; continue core strengthening with functional activities.    PT Home Exercise Plan  VHI, [id number]    Consulted and Agree with Plan of Care  Patient       Patient will benefit from skilled therapeutic intervention in order to improve the following deficits and impairments:     Visit Diagnosis: Chronic right-sided low back pain without sciatica  Other symptoms and signs involving the musculoskeletal system     Problem List Patient Active Problem List   Diagnosis Date Noted  . Lumbar degenerative disc disease 06/12/2019  . Right groin pain 08/17/2018  . HAIR LOSS 08/11/2009  . EPIGASTRIC PAIN 11/29/2006   Kerin Perna, PTA 06/28/19 5:58 PM  [place], [place], [address] Phone: [phone]   Fax:  [phone]  Name: Jimmy Mcknight MRN: [id number] Date of Birth: 26-Apr-1975

## 2019-07-02 ENCOUNTER — Encounter: Payer: 59 | Admitting: Rehabilitative and Restorative Service Providers"

## 2019-07-05 ENCOUNTER — Encounter: Payer: Self-pay | Admitting: Physical Therapy

## 2019-07-05 ENCOUNTER — Other Ambulatory Visit: Payer: Self-pay

## 2019-07-05 ENCOUNTER — Ambulatory Visit (INDEPENDENT_AMBULATORY_CARE_PROVIDER_SITE_OTHER): Payer: 59 | Admitting: Physical Therapy

## 2019-07-05 DIAGNOSIS — M545 Low back pain: Secondary | ICD-10-CM

## 2019-07-05 DIAGNOSIS — R29898 Other symptoms and signs involving the musculoskeletal system: Secondary | ICD-10-CM

## 2019-07-05 DIAGNOSIS — G8929 Other chronic pain: Secondary | ICD-10-CM

## 2019-07-05 NOTE — Patient Instructions (Signed)
Access Code: [email or web address] URL: [email or web address] Date: 07/05/2019 Prepared by: Roderic Scarce  Exercises Figure 4 Bridge - 3-4 x weekly - 2-3 sets - 10 reps Bird Dog - 3-4 x weekly - 2-3 sets - 10 reps - 3-5 hold The Diver - 3-4 x weekly - 2-3 sets - 10 reps

## 2019-07-05 NOTE — Therapy (Signed)
[place], [place], [address] Phone: [phone]   Fax:  [phone]  Physical Therapy Treatment  Patient Details  Name: Jimmy Mcknight MRN: [id number] Date of Birth: Dec 07, 1975 Referring Provider (PT): Dr Dianah Field    Encounter Date: 07/05/2019  PT End of Session - 07/05/19 1151    Visit Number  4    Number of Visits  12    Date for PT Re-Evaluation  08/02/19    PT Start Time  1152    PT Stop Time  1231    PT Time Calculation (min)  39 min    Activity Tolerance  Patient tolerated treatment well;No increased pain    Behavior During Therapy  [facility] for tasks assessed/performed       History reviewed. No pertinent past medical history.  History reviewed. No pertinent surgical history.  There were no vitals filed for this visit.  Subjective Assessment - 07/05/19 1152    Subjective  Pt reports stiffness first thing in the AM then loosens up about an hour later.    Pertinent History  ankle surgery ~ 8 yrs ago for torn tendons    Patient Stated Goals  get rid of back pain    Currently in Pain?  No/denies         [facility] PT Assessment - 07/05/19 0001      Assessment   Medical Diagnosis  Rt sided LBP     Referring Provider (PT)  Dr Dianah Field     Onset Date/Surgical Date  10/18/19    Hand Dominance  Right    Next MD Visit  07/24/19    Prior Therapy  none                    [facility] Adult PT Treatment/Exercise - 07/05/19 0001      Lumbar Exercises: Stretches   Passive Hamstring Stretch  Right;Left;30 seconds   supine with strap   Lower Trunk Rotation  1 rep;60 seconds    Hip Flexor Stretch  Left;Right;30 seconds   supine with pelvis on noodle and opposite KTC    ITB Stretch  Left;Right;1 rep;30 seconds   supine wiht strap.      Lumbar Exercises: Aerobic   Elliptical  L1: 3 min FWD, then 2' BWD      Lumbar Exercises: Machines for Strengthening   Leg Press  6 plates, 3x10       Lumbar Exercises: Standing   Other Standing Lumbar Exercises  2x10 divers each side.       Lumbar Exercises: Supine   Single Leg Bridge  20 reps   figure 4                 PT Long Term Goals - 07/05/19 1155      PT LONG TERM GOAL #1   Title  Decrease pain Rt LB by 50-75% allowing patient to return to all normal functional and work activities without pain    Baseline  50% improved    Period  Weeks    Status  On-going      PT LONG TERM GOAL #2   Title  Restore normal trunk ROM without pain      PT LONG TERM GOAL #3   Title  Patient to demonstrate good lifting techniques with hinged hip and core engaged    Status  On-going      PT LONG TERM GOAL #4   Title  Independent in  HEP    Status  On-going      PT LONG TERM GOAL #5   Title  Improve FOTO to </= 17% limitation    Status  On-going            Plan - 07/05/19 1219    Clinical Impression Statement  Jimmy Mcknight is making improvements, partially met his goals.  HEP was progressed today to add in strengthening. He was able to perform these with minimal cues, did fatigue.    Rehab Potential  Good    PT Frequency  2x / week    PT Duration  6 weeks    PT Treatment/Interventions  ADLs/Self Care Home Management;Patient/family education;Cryotherapy;Electrical Stimulation;Iontophoresis '4mg'$ /ml Dexamethasone;Moist Heat;Ultrasound;Functional mobility training;Therapeutic activities;Therapeutic exercise;Neuromuscular re-education;Manual techniques;Dry needling;Taping    PT Next Visit Plan  functional strengthening    Consulted and Agree with Plan of Care  Patient       Patient will benefit from skilled therapeutic intervention in order to improve the following deficits and impairments:     Visit Diagnosis: Chronic right-sided low back pain without sciatica  Other symptoms and signs involving the musculoskeletal system     Problem List Patient Active Problem List   Diagnosis Date Noted  . Lumbar degenerative  disc disease 06/12/2019  . Right groin pain 08/17/2018  . HAIR LOSS 08/11/2009  . EPIGASTRIC PAIN 11/29/2006   Jeral Pinch, PT 07/05/19 12:37 PM  S4/04/2019, 12:28 PM  [place], [place], [address] Phone: [phone]   Fax:  [phone]  Name: Jimmy Mcknight MRN: [id number] Date of Birth: [birth year]

## 2019-07-09 ENCOUNTER — Ambulatory Visit (INDEPENDENT_AMBULATORY_CARE_PROVIDER_SITE_OTHER): Payer: 59 | Admitting: Rehabilitative and Restorative Service Providers"

## 2019-07-09 ENCOUNTER — Other Ambulatory Visit: Payer: Self-pay

## 2019-07-09 ENCOUNTER — Encounter: Payer: Self-pay | Admitting: Rehabilitative and Restorative Service Providers"

## 2019-07-09 DIAGNOSIS — M545 Low back pain, unspecified: Secondary | ICD-10-CM

## 2019-07-09 DIAGNOSIS — R29898 Other symptoms and signs involving the musculoskeletal system: Secondary | ICD-10-CM | POA: Diagnosis not present

## 2019-07-09 DIAGNOSIS — G8929 Other chronic pain: Secondary | ICD-10-CM

## 2019-07-09 NOTE — Therapy (Signed)
[facility] [place] [address] [place], [place], [address] Phone: [phone]   Fax:  [phone]  Physical Therapy Treatment  Patient Details  Name: Jimmy Mcknight MRN: [id number] Date of Birth: 1975-05-23 Referring Provider (PT): Dr Jimmy Mcknight    Encounter Date: 07/09/2019  PT End of Session - 07/09/19 1601    Visit Number  5    Number of Visits  12    Date for PT Re-Evaluation  08/02/19    PT Start Time  1559    PT Stop Time  1640    PT Time Calculation (min)  41 min    Activity Tolerance  Patient tolerated treatment well       History reviewed. No pertinent past medical history.  History reviewed. No pertinent surgical history.  There were no vitals filed for this visit.  Subjective Assessment - 07/09/19 1602    Subjective  Still has some discomfort and stiffnes in the morning when he awakens.    Currently in Pain?  No/denies         [facility] PT Assessment - 07/09/19 0001      Assessment   Medical Diagnosis  Rt sided LBP     Referring Provider (PT)  Dr Jimmy Mcknight     Onset Date/Surgical Date  10/18/19    Hand Dominance  Right    Next MD Visit  07/24/19    Prior Therapy  none       AROM   Lumbar Flexion  WNL    Lumbar Extension  45%, pain in the Rt LB    Lumbar - Right Side Bend  WNL - mild pain in R LB at end range    Lumbar - Left Side Bend  WNL    Lumbar - Right Rotation  WNL    Lumbar - Left Rotation  85% no pain                    [facility] Adult PT Treatment/Exercise - 07/09/19 0001      Therapeutic Activites    Therapeutic Activities  --   myofacial ball release work Rt lower lumbar musculature      Lumbar Exercises: Stretches   Passive Hamstring Stretch  Right;Left;30 seconds;2 reps;60 seconds   supine with strap   Lower Trunk Rotation  1 rep;60 seconds    ITB Stretch  Left;Right;1 rep;30 seconds   supine wiht strap.    Other Lumbar Stretch Exercise  lat stretch supine PT assist  with knee to chest 30 sec hold x 3 reps       Lumbar Exercises: Aerobic   Elliptical  L1: 3 min FWD, then 2' BWD      Lumbar Exercises: Standing   Lifting  From 12";15 reps   7.5# weight hinged hip lift    Push / Pull Sled  push pull green TB - derotation exercise x 15 each side - note rounded shoulders increased pec work     Other Standing Lumbar Exercises  plank on counter 60sec x 3; plank with alternate hip extension x 10 reps each LE x 2 sets       Lumbar Exercises: Supine   Bridge  10 reps   alternate knee extension w/ bridge      Lumbar Exercises: Quadruped   Madcat/Old Horse  5 reps   added 5 reps with green TB resistance    Other Quadruped Lumbar Exercises  child's pose 30 sec x 3 reps  PT Education - 07/09/19 1632    Education Details  HEP    Person(s) Educated  Patient    Methods  Explanation;Demonstration;Tactile cues;Verbal cues;Handout    Comprehension  Verbalized understanding;Returned demonstration;Verbal cues required;Tactile cues required          PT Long Term Goals - 07/05/19 1155      PT LONG TERM GOAL #1   Title  Decrease pain Rt LB by 50-75% allowing patient to return to all normal functional and work activities without pain    Baseline  50% improved    Period  Weeks    Status  On-going      PT LONG TERM GOAL #2   Title  Restore normal trunk ROM without pain      PT LONG TERM GOAL #3   Title  Patient to demonstrate good lifting techniques with hinged hip and core engaged    Status  On-going      PT LONG TERM GOAL #4   Title  Independent in HEP    Status  On-going      PT LONG TERM GOAL #5   Title  Improve FOTO to </= 17% limitation    Status  On-going            Plan - 07/09/19 1617    Clinical Impression Statement  continued improvement with decreased Rt sided back pain. Patient is progressing with core strengthening and stabilization. Worked on lifting technique and discussed positions for sleep. Patient to try  direct pressure/myofacial ball release work to Rt lower lumbar area. Muscular tightness noted through the lats and OL area. May benefit from DN but is hesitant to try    Rehab Potential  Good    PT Frequency  2x / week    PT Duration  6 weeks    PT Treatment/Interventions  ADLs/Self Care Home Management;Patient/family education;Cryotherapy;Electrical Stimulation;Iontophoresis 4mg /ml Dexamethasone;Moist Heat;Ultrasound;Functional mobility training;Therapeutic activities;Therapeutic exercise;Neuromuscular re-education;Manual techniques;Dry needling;Taping    PT Next Visit Plan  functional strengthening; trial of deep tissue release work for Rt lower lumbar - Lats/QL    PT Home Exercise Plan  VHI, [id number]    Consulted and Agree with Plan of Care  Patient       Patient will benefit from skilled therapeutic intervention in order to improve the following deficits and impairments:     Visit Diagnosis: Chronic right-sided low back pain without sciatica  Other symptoms and signs involving the musculoskeletal system     Problem List Patient Active Problem List   Diagnosis Date Noted  . Lumbar degenerative disc disease 06/12/2019  . Right groin pain 08/17/2018  . HAIR LOSS 08/11/2009  . EPIGASTRIC PAIN 11/29/2006    Jimmy Mcknight Jimmy Mcknight PT, MPH  07/09/2019, 5:09 PM  [facility] [place], [place], [address] Phone: [phone]   Fax:  [phone]  Name: Jimmy Mcknight MRN: [id number] Date of Birth: Aug 16, 1975

## 2019-07-09 NOTE — Patient Instructions (Addendum)
Scapula Adduction With Pectorals, Low   Stand in doorframe with palms against frame and arms at 45. Lean forward and squeeze shoulder blades. Hold __30_ seconds. Repeat __3_ times per session. Do _2__ sessions per day.   Scapula Adduction With Pectorals, Mid-Range   Stand in doorframe with palms against frame and arms at 90. Lean forward and squeeze shoulder blades. Hold _30__ seconds. Repeat __3_ times per session. Do _2__ sessions per day.  \Scapula Adduction With Pectorals, High   Stand in doorframe with palms against frame and arms at 120. Lean forward and squeeze shoulder blades. Hold __30_ seconds. Repeat __3_ times per session. Do _2__ sessions per day.  .    Access Code: [email or web address]: [email or web address]: 04/05/2021Prepared by: Kebin Maye HoltExercises  Supine Hamstring Stretch with Strap - 2 x daily - 7 x weekly - 1 sets - 2 reps - 20 hold  Supine Double Knee to Chest - 2 x daily - 7 x weekly - 1 sets - 2 reps - 20 hold  Prone Press Up on Elbows - 1 x daily - 7 x weekly - 1 sets - 3-5 reps - 5 hold  Supine Bridge - 1 x daily - 7 x weekly - 1 sets - 10 reps  Supine Transversus Abdominis Bracing - Hands on Stomach - 1 x daily - 7 x weekly - 1 sets - 10 reps - 5 hold  Single Leg Bridge - 2 x daily - 7 x weekly - 1 sets - 10 reps - 10 sec hold  Plank with Hands on Table - 2 x daily - 7 x weekly - 1 sets - 3 reps - 60 sec hold  Plank with Hip Extension on Counter - 2 x daily - 7 x weekly - 3 sets - 10 reps - 60 sec hold  Plank on Forearms with Scapular Protraction Retraction AROM - 2 x daily - 7 x weekly - 3 reps - 1 sets - 30 sec hold

## 2019-07-12 ENCOUNTER — Encounter: Payer: 59 | Admitting: Physical Therapy

## 2019-07-19 ENCOUNTER — Encounter: Payer: 59 | Admitting: Physical Therapy

## 2019-07-20 ENCOUNTER — Other Ambulatory Visit: Payer: Self-pay

## 2019-07-20 ENCOUNTER — Encounter: Payer: Self-pay | Admitting: Physical Therapy

## 2019-07-20 ENCOUNTER — Ambulatory Visit (INDEPENDENT_AMBULATORY_CARE_PROVIDER_SITE_OTHER): Payer: 59 | Admitting: Physical Therapy

## 2019-07-20 DIAGNOSIS — G8929 Other chronic pain: Secondary | ICD-10-CM | POA: Diagnosis not present

## 2019-07-20 DIAGNOSIS — M545 Low back pain: Secondary | ICD-10-CM

## 2019-07-20 DIAGNOSIS — R29898 Other symptoms and signs involving the musculoskeletal system: Secondary | ICD-10-CM | POA: Diagnosis not present

## 2019-07-20 NOTE — Therapy (Addendum)
[place], [place], [address] Phone: [phone]   Fax:  [phone]  Physical Therapy Treatment  Patient Details  Name: Jimmy Mcknight MRN: [id number] Date of Birth: 08-Oct-1975 Referring Provider (PT): Dr Dianah Field    Encounter Date: 07/20/2019  PT End of Session - 07/20/19 1204    Visit Number  6    Number of Visits  12    Date for PT Re-Evaluation  08/02/19    PT Start Time  1103    PT Stop Time  1147    PT Time Calculation (min)  44 min    Activity Tolerance  Patient tolerated treatment well    Behavior During Therapy  [facility] for tasks assessed/performed       History reviewed. No pertinent past medical history.  History reviewed. No pertinent surgical history.  There were no vitals filed for this visit.  Subjective Assessment - 07/20/19 1106    Subjective  He reports that he has not had any pain in a week. Slight stiffness when he first wakes up. Stretches have helped him the most.    Currently in Pain?  No/denies    Pain Score  0-No pain    Aggravating Factors   quick movements    Pain Relieving Factors  HEP with the stretches         [facility] PT Assessment - 07/20/19 0001      Assessment   Medical Diagnosis  Rt sided LBP     Referring Provider (PT)  Dr Dianah Field     Onset Date/Surgical Date  10/18/19    Hand Dominance  Right    Next MD Visit  07/24/19    Prior Therapy  none       Observation/Other Assessments   Focus on Therapeutic Outcomes (FOTO)   6% limitation      AROM   AROM Assessment Site  --   no pain   Lumbar Flexion  WNL    Lumbar Extension  WNL    Lumbar - Right Side Bend  WNL    Lumbar - Left Side Bend  WNL    Lumbar - Right Rotation  WNL    Lumbar - Left Rotation  WNL        [facility] Adult PT Treatment/Exercise - 07/20/19 0001      Exercises   Exercises  Lumbar  standing side standing core partial twists with red theraband in door  10 reps x 2 sets       Lumbar Exercises: Stretches   Other Lumbar Stretch Exercise  double knees to chest, then LTR x 2; Rt and Lt. 30 seconds    Other Lumbar Stretch Exercise  single leg LTR stretch Rt/ Lft 1 set 30 sec holds      Lumbar Exercises: Aerobic   Elliptical  L2: 3 min FWD      Lumbar Exercises: Standing   Functional Squats Limitations  17 lb box lift waist <-> knees x 5; floor to chest x 5     Other Standing Lumbar Exercises  forearm plank; 1 rep 15 seconds, 2 reps 20 seconds    Other Standing Lumbar Exercises  side plank, 2 sets, Rt and Lt, 10 seconds      Lumbar Exercises: Supine   Single Leg Bridge  5 reps;3 seconds   alternating sides     Lumbar Exercises: Prone   Other Prone Lumbar Exercises  progressive supermans LE-UE-LE/UE.    arms by  side, then T, the extended out in front - 3 reps of each position     Lumbar Exercises: Quadruped   Madcat/Old Horse  5 reps   2 sets between heart melt pose   Other Quadruped Lumbar Exercises  childs pose Rt, Lt and center; 15 seconds each    Other Quadruped Lumbar Exercises  heart melting pose x 2, 20 sec.   knees under hips, hand walk out until forehead rests on mat        PT Long Term Goals - 07/20/19 1119      PT LONG TERM GOAL #1   Title  Decrease pain Rt LB by 50-75% allowing patient to return to all normal functional and work activities without pain    Period  Weeks    Status  Achieved      PT LONG TERM GOAL #2   Title  Restore normal trunk ROM without pain    Status  Achieved      PT LONG TERM GOAL #3   Title  Patient to demonstrate good lifting techniques with hinged hip and core engaged    Status  Achieved      PT LONG TERM GOAL #4   Title  Independent in HEP    Status  Achieved      PT LONG TERM GOAL #5   Title  Improve FOTO to </= 17% limitation    Status  Achieved      Plan - 07/20/19 1205    Clinical Impression Statement  Pt has progressed well, reporting 100% improvement since initiating therapy.  Pt also has  been compliant with HEP.  Pt has progressed well with core strength and body mechanics when lifting.   All goals met; will hold 30 days in case of flare up.    Rehab Potential  Good    PT Frequency  2x / week    PT Duration  6 weeks    PT Treatment/Interventions  ADLs/Self Care Home Management;Patient/family education;Cryotherapy;Electrical Stimulation;Iontophoresis '4mg'$ /ml Dexamethasone;Moist Heat;Ultrasound;Functional mobility training;Therapeutic activities;Therapeutic exercise;Neuromuscular re-education;Manual techniques;Dry needling;Taping    PT Next Visit Plan  spoke to supervising PT; will hold 30 days (until 08/18/19); if pt doesn't return, will d/c.    PT Home Exercise Plan  VHI, [id number]    Consulted and Agree with Plan of Care  Patient       Patient will benefit from skilled therapeutic intervention in order to improve the following deficits and impairments:     Visit Diagnosis: Chronic right-sided low back pain without sciatica  Other symptoms and signs involving the musculoskeletal system     Problem List Patient Active Problem List   Diagnosis Date Noted  . Lumbar degenerative disc disease 06/12/2019  . Right groin pain 08/17/2018  . HAIR LOSS 08/11/2009  . EPIGASTRIC PAIN 11/29/2006    Ronaldo Miyamoto, SPTA 07/20/19 3:37 PM   Kerin Perna, PTA 07/20/19 3:38 PM  PHYSICAL THERAPY DISCHARGE SUMMARY  Visits from Start of Care: 6  Current functional level related to goals / functional outcomes: See progress note for discharge status   Remaining deficits: Unknown    Education / Equipment: HEP  Plan: Patient agrees to discharge.  Patient goals were partially met. Patient is being discharged due to not returning since the last visit.  ?????     Celyn P. Helene Kelp PT, MPH 10/04/19 3:35 PM    [facility] [place], [place], [address] Phone: [phone]   Fax:  [phone]  Name:  Jimmy Mcknight MRN: [id number] Date of Birth: 08-Jan-1976

## 2019-07-24 ENCOUNTER — Ambulatory Visit (INDEPENDENT_AMBULATORY_CARE_PROVIDER_SITE_OTHER): Payer: 59 | Admitting: Sports Medicine

## 2019-07-24 ENCOUNTER — Encounter: Payer: Self-pay | Admitting: Sports Medicine

## 2019-07-24 ENCOUNTER — Other Ambulatory Visit: Payer: Self-pay

## 2019-07-24 DIAGNOSIS — M5136 Other intervertebral disc degeneration, lumbar region: Secondary | ICD-10-CM | POA: Diagnosis not present

## 2019-07-24 DIAGNOSIS — M51369 Other intervertebral disc degeneration, lumbar region without mention of lumbar back pain or lower extremity pain: Secondary | ICD-10-CM

## 2019-07-24 NOTE — Progress Notes (Signed)
    Procedures performed today:    None.  Independent interpretation of notes and tests performed by another provider:   None.  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    ___________________________________________ Ihor Austin. Benjamin Stain, M.D., ABFM., CAQSM. Primary Care and Sports Medicine [facility]  Adjunct Instructor of Family Medicine  [employer]

## 2019-07-24 NOTE — Assessment & Plan Note (Signed)
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

## 2019-07-26 ENCOUNTER — Encounter: Payer: 59 | Admitting: Rehabilitative and Restorative Service Providers"

## 2020-12-06 IMAGING — DX LUMBAR SPINE - COMPLETE 4+ VIEW
3 series · 3 of 3 positions shown · non-contrast
Comparison: None.

CLINICAL DATA: Right groin pain.  Lifting injury 2 weeks ago

EXAM:
LUMBAR SPINE - COMPLETE 4+ VIEW

[pelvis ap]
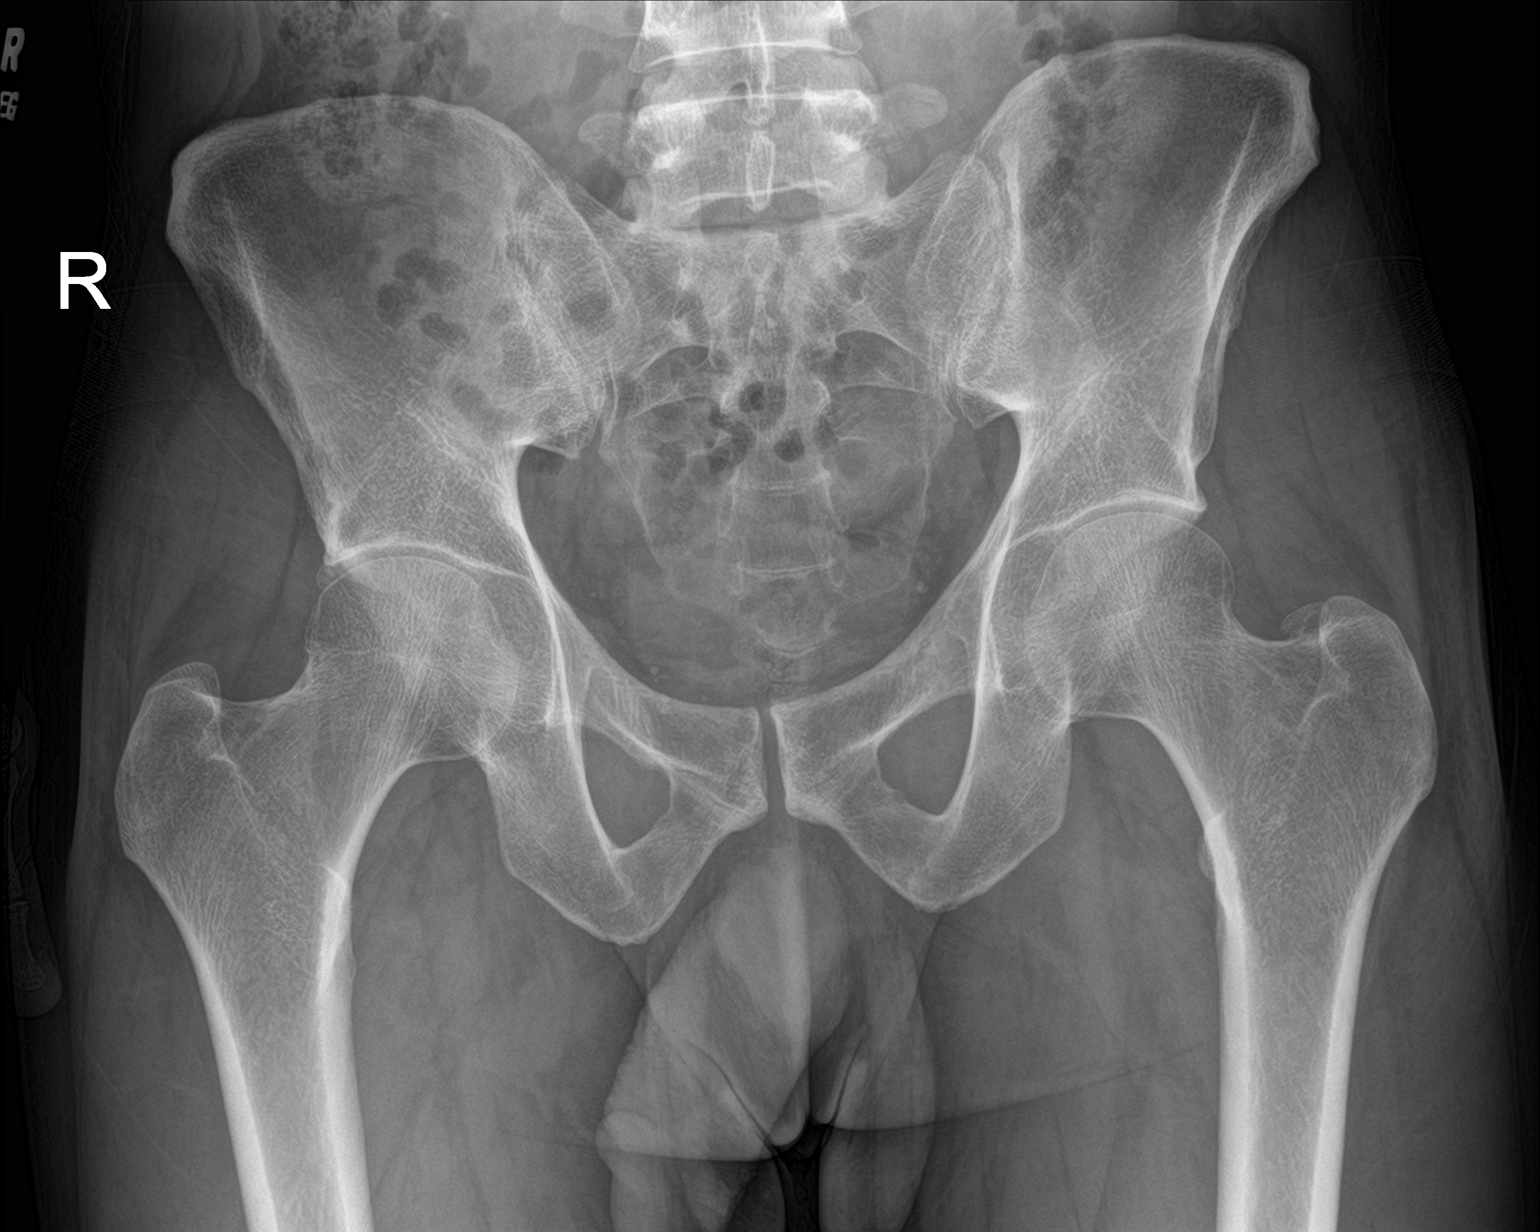

[hip ap]
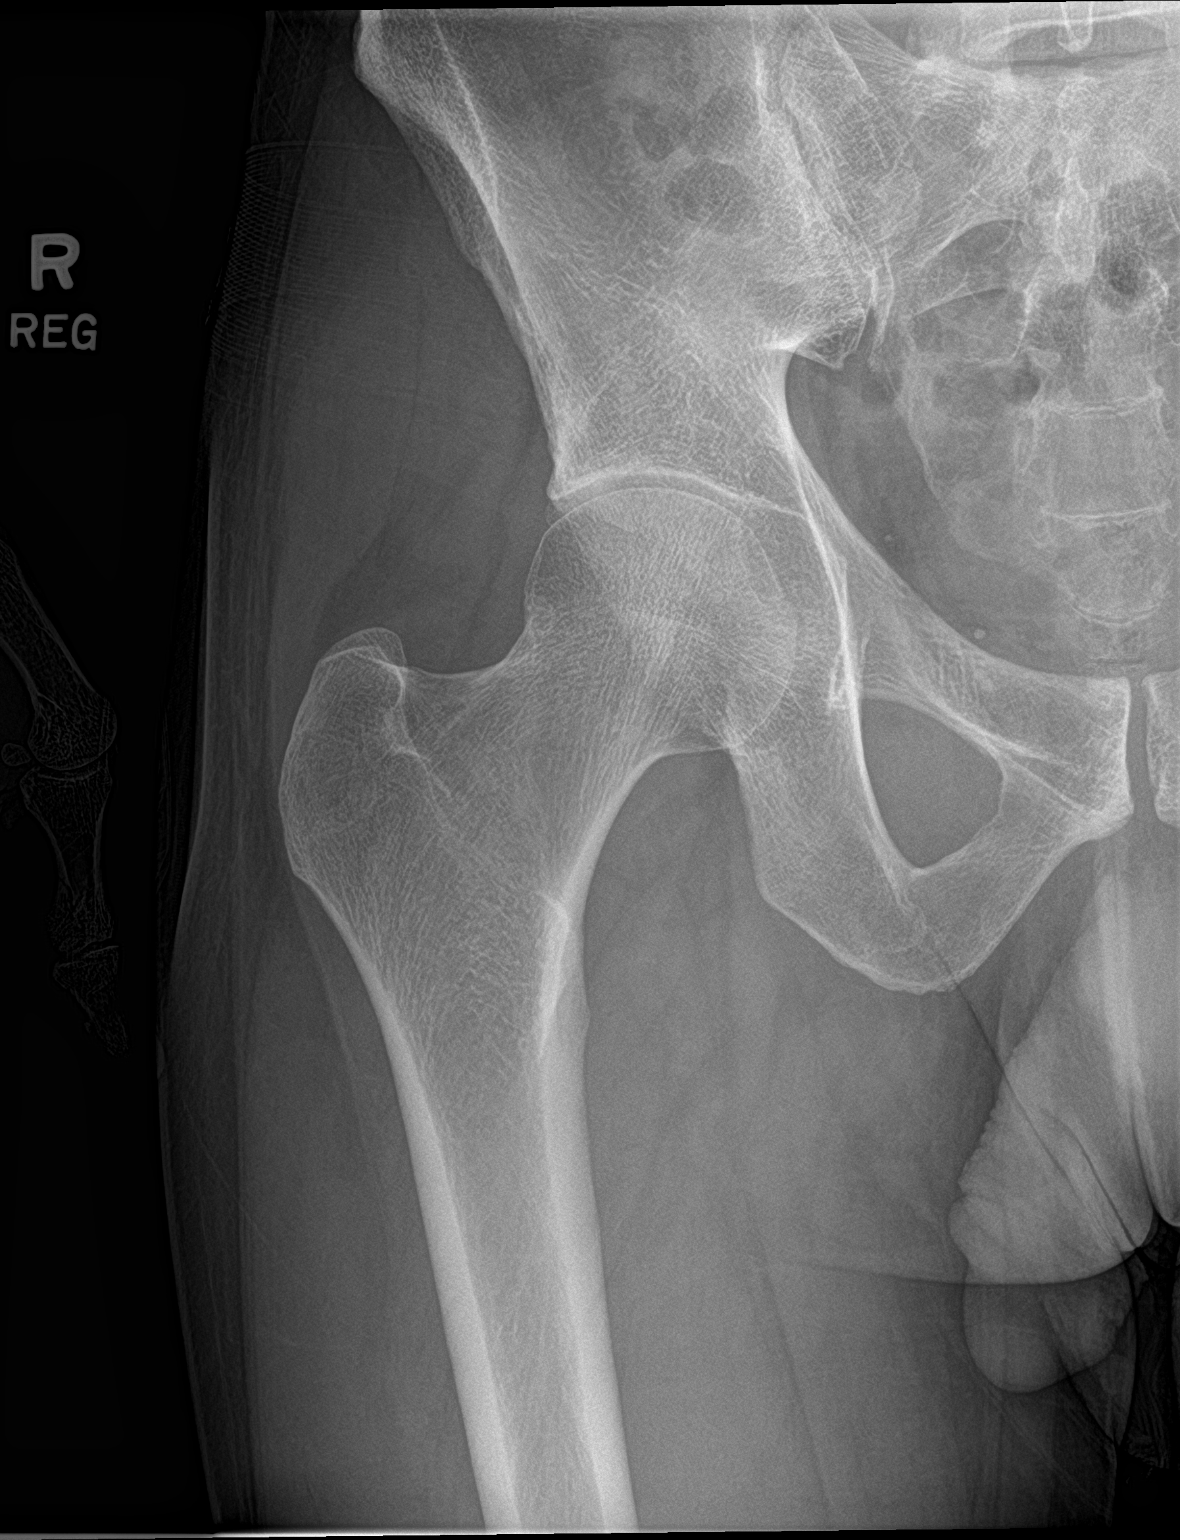

[hip lat]
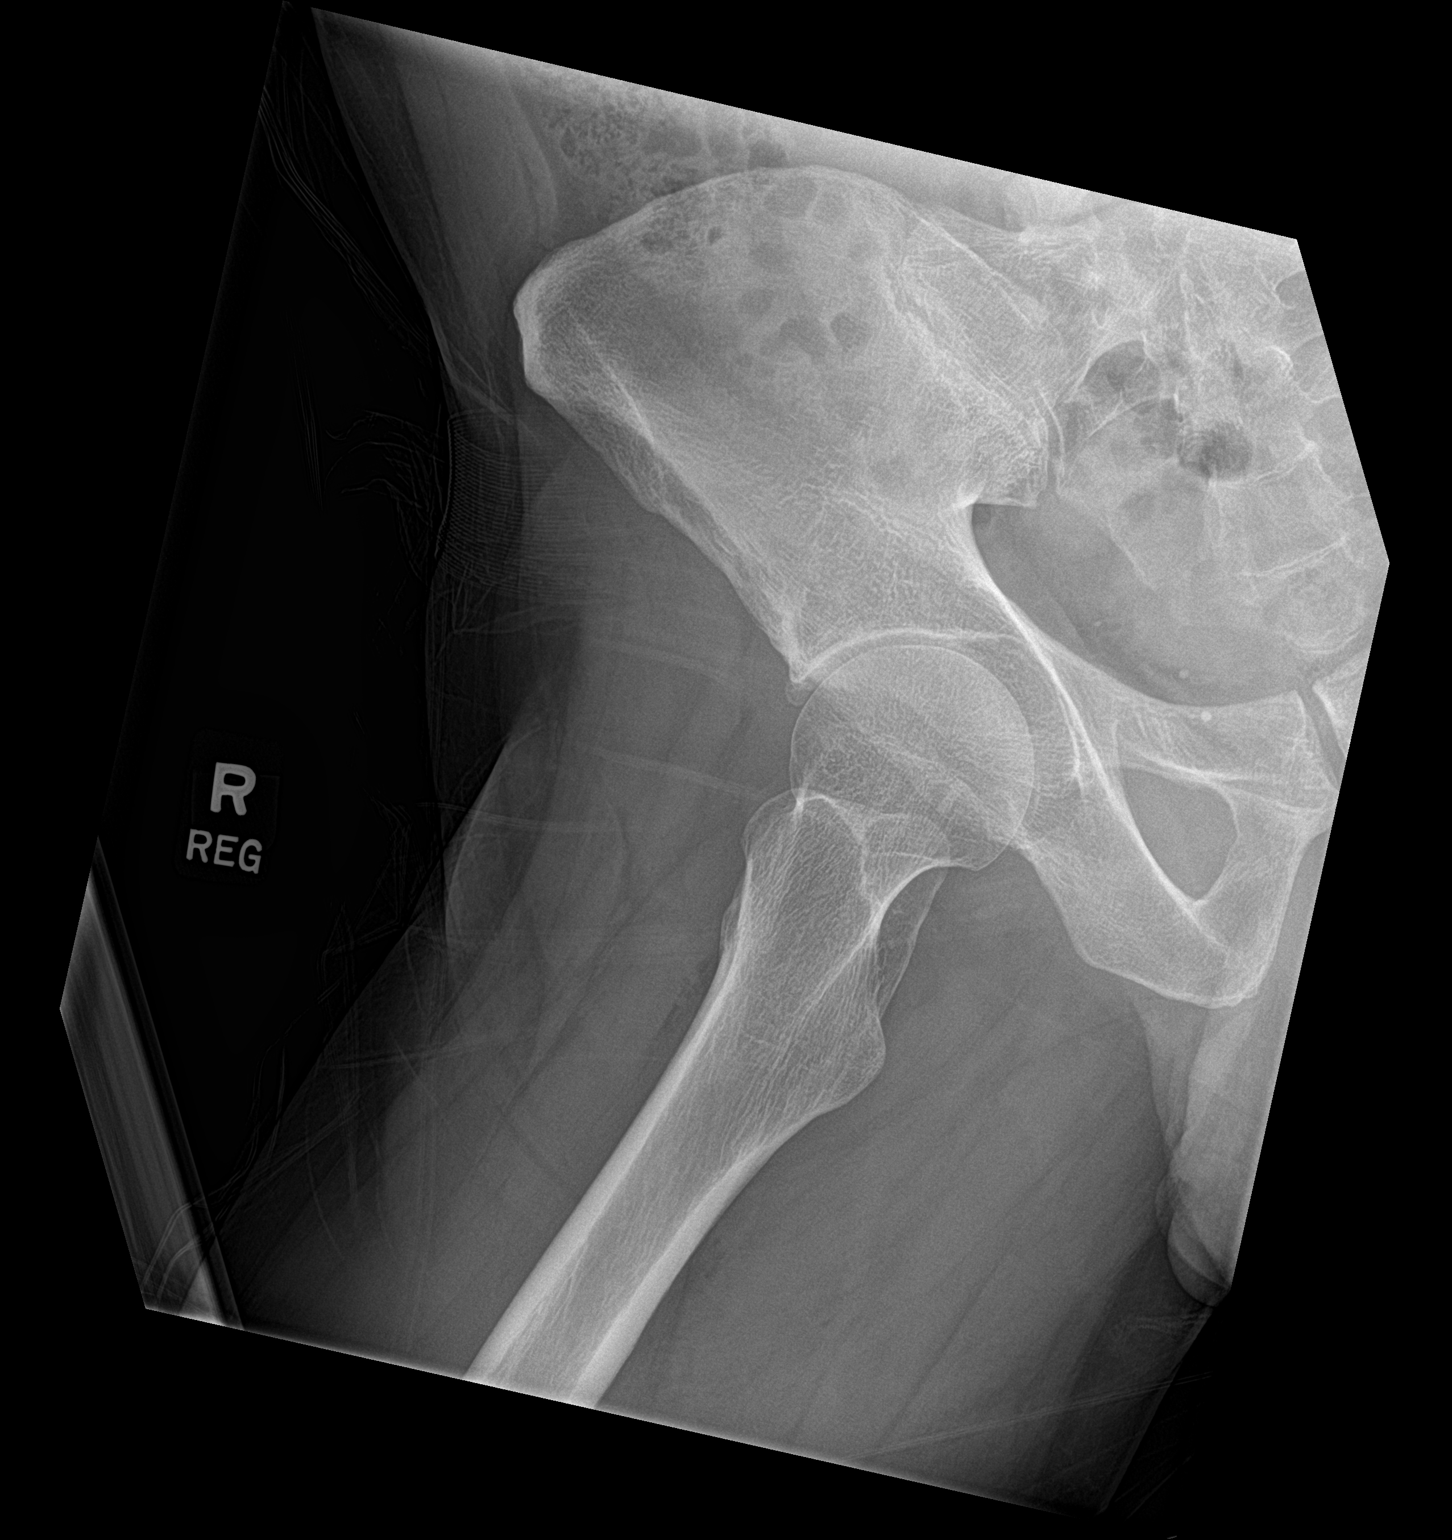

[3 of 3 positions shown; findings below may reference images not displayed]

FINDINGS: There is no evidence of lumbar spine fracture. Alignment is normal.
Intervertebral disc spaces are maintained.
IMPRESSION: Negative.

## 2021-01-12 ENCOUNTER — Telehealth: Payer: Self-pay

## 2021-01-12 NOTE — Telephone Encounter (Signed)
Thank you I will call patient and let him know.

## 2021-01-12 NOTE — Telephone Encounter (Signed)
Patient said that Dr.T IS HIS PCP. He didn't understand why we were telling him he wasn't. Explained to patient that he has only seen Dr.T for sports related issues. Let patient know that I can establish him with one of the providers that are taking new patient. Patient refused and wanted to speak to Dr.T CMA. He states he was told to follow up with his pcp/sports medicine doctor.

## 2021-01-12 NOTE — Telephone Encounter (Signed)
Looks like it was [employer], I am his sports medicine provider.  He would probably need to reestablish care with someone here, I would see if Linford Arnold would be willing to take him back, but frankly anyone here can remove stitches (could even be a nurse visit).

## 2021-01-12 NOTE — Telephone Encounter (Signed)
Patient stated he had stitches that he wanted removed. Patient said that you were his PCP and have been for a while but I did not see you listed. Patient wanted me to ask you. Please advise.

## 2021-01-13 NOTE — Telephone Encounter (Signed)
Appointment has been made as a new patient.

## 2021-01-20 ENCOUNTER — Encounter: Payer: Self-pay | Admitting: Sports Medicine

## 2021-01-20 ENCOUNTER — Other Ambulatory Visit: Payer: Self-pay

## 2021-01-20 ENCOUNTER — Ambulatory Visit (INDEPENDENT_AMBULATORY_CARE_PROVIDER_SITE_OTHER): Payer: 59 | Admitting: Sports Medicine

## 2021-01-20 VITALS — BP 130/81 | HR 95 | Ht 70.0 in | Wt 163.0 lb

## 2021-01-20 DIAGNOSIS — Z0000 Encounter for general adult medical examination without abnormal findings: Secondary | ICD-10-CM | POA: Insufficient documentation

## 2021-01-20 DIAGNOSIS — S61411D Laceration without foreign body of right hand, subsequent encounter: Secondary | ICD-10-CM

## 2021-01-20 DIAGNOSIS — S61411A Laceration without foreign body of right hand, initial encounter: Secondary | ICD-10-CM | POA: Insufficient documentation

## 2021-01-20 DIAGNOSIS — R030 Elevated blood-pressure reading, without diagnosis of hypertension: Secondary | ICD-10-CM | POA: Diagnosis not present

## 2021-01-20 NOTE — Assessment & Plan Note (Signed)
Excellently put hand through glass, laceration on the right thenar eminence, right dorsal forearm. Was seen in the emergency department approximately 10 days ago, sutures placed. Per report there was muscle laceration but no tendon involvement. On exam he has good function to all of the muscles of his thenar eminence including extensor pollicis longus, abductor pollicis longus, adductor pollicis, flexor pollicis longus and brevis, also good function to the lumbricals to the second and third digits. Sensations intact. He is a smoker, I do not think the wound is healed enough to remove the stitches so we will leave them in for another 7 days.

## 2021-01-20 NOTE — Progress Notes (Signed)
    Procedures performed today:    None.  Independent interpretation of notes and tests performed by another provider:   None.  Brief History, Exam, Impression, and Recommendations:    Laceration of hand, right Excellently put hand through glass, laceration on the right thenar eminence, right dorsal forearm. Was seen in the emergency department approximately 10 days ago, sutures placed. Per report there was muscle laceration but no tendon involvement. On exam he has good function to all of the muscles of his thenar eminence including extensor pollicis longus, abductor pollicis longus, adductor pollicis, flexor pollicis longus and brevis, also good function to the lumbricals to the second and third digits. Sensations intact. He is a smoker, I do not think the wound is healed enough to remove the stitches so we will leave them in for another 7 days.  Annual physical exam Ordering routine labs. Declines vaccinations. He can get the labs done sometime later this week, understands he will need to be fasting for 8 hours.   ___________________________________________ Ihor Austin. Benjamin Stain, M.D., ABFM., CAQSM. Primary Care and Sports Medicine [facility]  Adjunct Instructor of Family Medicine  [employer]

## 2021-01-20 NOTE — Assessment & Plan Note (Signed)
Ordering routine labs. Declines vaccinations. He can get the labs done sometime later this week, understands he will need to be fasting for 8 hours.

## 2021-01-27 ENCOUNTER — Ambulatory Visit (INDEPENDENT_AMBULATORY_CARE_PROVIDER_SITE_OTHER): Payer: 59 | Admitting: Sports Medicine

## 2021-01-27 DIAGNOSIS — S61411D Laceration without foreign body of right hand, subsequent encounter: Secondary | ICD-10-CM

## 2021-01-27 NOTE — Assessment & Plan Note (Signed)
Today I did remove sutures from his thenar eminence and dorsal forearm. Applied a bit of Dermabond as the thenar eminence was only partially healed, though it was not dehiscing at the time of exam. Return to see me as needed.

## 2021-01-27 NOTE — Progress Notes (Signed)
    Procedures performed today:    None.  Independent interpretation of notes and tests performed by another provider:   None.  Brief History, Exam, Impression, and Recommendations:    Laceration of hand, right Today I did remove sutures from his thenar eminence and dorsal forearm. Applied a bit of Dermabond as the thenar eminence was only partially healed, though it was not dehiscing at the time of exam. Return to see me as needed.    ___________________________________________ Ihor Austin. Benjamin Stain, M.D., ABFM., CAQSM. Primary Care and Sports Medicine [facility]  Adjunct Instructor of Family Medicine  [employer]

## 2021-01-28 LAB — COMPREHENSIVE METABOLIC PANEL
AG Ratio: 1.7 (calc) (ref 1.0–2.5)
ALT: 26 U/L (ref 9–46)
AST: 16 U/L (ref 10–40)
Albumin: 4.5 g/dL (ref 3.6–5.1)
Alkaline phosphatase (APISO): 66 U/L (ref 36–130)
BUN: 11 mg/dL (ref 7–25)
CO2: 26 mmol/L (ref 20–32)
Calcium: 9.7 mg/dL (ref 8.6–10.3)
Chloride: 104 mmol/L (ref 98–110)
Creat: 0.74 mg/dL (ref 0.60–1.29)
Globulin: 2.7 g/dL (calc) (ref 1.9–3.7)
Glucose, Bld: 91 mg/dL (ref 65–99)
Potassium: 4 mmol/L (ref 3.5–5.3)
Sodium: 140 mmol/L (ref 135–146)
Total Bilirubin: 0.6 mg/dL (ref 0.2–1.2)
Total Protein: 7.2 g/dL (ref 6.1–8.1)

## 2021-01-28 LAB — CBC
HCT: 40.2 % (ref 38.5–50.0)
Hemoglobin: 13.8 g/dL (ref 13.2–17.1)
MCH: 30.6 pg (ref 27.0–33.0)
MCHC: 34.3 g/dL (ref 32.0–36.0)
MCV: 89.1 fL (ref 80.0–100.0)
MPV: 10.1 fL (ref 7.5–12.5)
Platelets: 261 10*3/uL (ref 140–400)
RBC: 4.51 10*6/uL (ref 4.20–5.80)
RDW: 11.8 % (ref 11.0–15.0)
WBC: 6.2 10*3/uL (ref 3.8–10.8)

## 2021-01-28 LAB — LIPID PANEL
Cholesterol: 219 mg/dL — ABNORMAL HIGH (ref <200)
HDL: 48 mg/dL (ref >=40)
LDL Cholesterol (Calc): 152 mg/dL (calc) — ABNORMAL HIGH
Non-HDL Cholesterol (Calc): 171 mg/dL (calc) — ABNORMAL HIGH (ref <130)
Total CHOL/HDL Ratio: 4.6 (calc) (ref <5.0)
Triglycerides: 86 mg/dL (ref <150)

## 2021-01-28 LAB — TSH: TSH: 0.46 mIU/L (ref 0.40–4.50)

## 2023-09-01 ENCOUNTER — Ambulatory Visit (INDEPENDENT_AMBULATORY_CARE_PROVIDER_SITE_OTHER): Admitting: Sports Medicine

## 2023-09-01 ENCOUNTER — Encounter: Payer: Self-pay | Admitting: Sports Medicine

## 2023-09-01 VITALS — BP 114/72 | HR 94 | Resp 20 | Ht 70.0 in | Wt 154.0 lb

## 2023-09-01 DIAGNOSIS — N5082 Scrotal pain: Secondary | ICD-10-CM

## 2023-09-01 NOTE — Progress Notes (Signed)
    Procedures performed today:    None.  Independent interpretation of notes and tests performed by another provider:   None.  Brief History, Exam, Impression, and Recommendations:    Pain, scrotum Pleasant 48 year old male, he recalls lifting a lawn more than having some pain left groin and left hemiscrotum, no obvious bulges visible to him. No GI symptoms. No pain or burning when he pees, no discharge, no other trauma. On exam he does have a palpable varicocele left hemiscrotum, scrotal contents are otherwise normal, on palpation of the external inguinal ring I can feel a palpable hernia with the patient Valsalva's. I do think he has with a varicocele and an inguinal hernia. I would like a scrotal ultrasound and consultation with general surgery.    ____________________________________________ Joselyn Nicely. Sandy Crumb, M.D., ABFM., CAQSM., AME. Primary Care and Sports Medicine [facility]  Adjunct Professor of [facility] Medicine  [employer]  Restaurant manager, fast food

## 2023-09-01 NOTE — Assessment & Plan Note (Signed)
 Pleasant 48 year old male, he recalls lifting a lawn more than having some pain left groin and left hemiscrotum, no obvious bulges visible to him. No GI symptoms. No pain or burning when he pees, no discharge, no other trauma. On exam he does have a palpable varicocele left hemiscrotum, scrotal contents are otherwise normal, on palpation of the external inguinal ring I can feel a palpable hernia with the patient Valsalva's. I do think he has with a varicocele and an inguinal hernia. I would like a scrotal ultrasound and consultation with general surgery.

## 2023-09-05 ENCOUNTER — Ambulatory Visit

## 2023-09-05 DIAGNOSIS — N5082 Scrotal pain: Secondary | ICD-10-CM | POA: Diagnosis not present

## 2023-09-09 ENCOUNTER — Ambulatory Visit: Payer: Self-pay | Admitting: Sports Medicine

## 2023-09-12 ENCOUNTER — Telehealth: Payer: Self-pay

## 2023-09-12 NOTE — Telephone Encounter (Signed)
 Copied from CRM [id number]. Topic: General - Other >> Sep 12, 2023 11:04 AM Adrianna P wrote: Reason for CRM: Patient called to inform Dr. Elva Hamburger that he has talked to a surgeon and is waiting on him to set up a date

## 2023-09-30 ENCOUNTER — Ambulatory Visit: Payer: Self-pay | Admitting: General Surgery

## 2023-09-30 NOTE — Pre-Procedure Instructions (Signed)
 Surgical Instructions   Your procedure is scheduled on October 11, 2023. Report to [facility] Main Entrance A at 5:30 A.M., then check in with the Admitting office. Any questions or running late day of surgery: call [phone]  Questions prior to your surgery date: call [phone], Monday-Friday, 8am-4pm. If you experience any cold or flu symptoms such as cough, fever, chills, shortness of breath, etc. between now and your scheduled surgery, please notify us  at the above number.     Remember:  Do not eat or drink after midnight the night before your surgery    Take these medicines the morning of surgery with A SIP OF WATER: NONE   One week prior to surgery, STOP taking any Aspirin (unless otherwise instructed by your surgeon) Aleve, Naproxen, Ibuprofen, Motrin, Advil, Goody's, BC's, all herbal medications, fish oil, and non-prescription vitamins.                     Do NOT Smoke (Tobacco/Vaping) for 24 hours prior to your procedure.  If you use a CPAP at night, you may bring your mask/headgear for your overnight stay.   You will be asked to remove any contacts, glasses, piercing's, hearing aid's, dentures/partials prior to surgery. Please bring cases for these items if needed.    Patients discharged the day of surgery will not be allowed to drive home, and someone needs to stay with them for 24 hours.  SURGICAL WAITING ROOM VISITATION Patients may have no more than 2 support people in the waiting area - these visitors may rotate.   Pre-op nurse will coordinate an appropriate time for 1 ADULT support person, who may not rotate, to accompany patient in pre-op.  Children under the age of 77 must have an adult with them who is not the patient and must remain in the main waiting area with an adult.  If the patient needs to stay at the hospital during part of their recovery, the visitor guidelines for inpatient rooms apply.  Please refer to the [facility] website for the visitor  guidelines for any additional information.   If you received a COVID test during your pre-op visit  it is requested that you wear a mask when out in public, stay away from anyone that may not be feeling well and notify your surgeon if you develop symptoms. If you have been in contact with anyone that has tested positive in the last 10 days please notify you surgeon.      Pre-operative CHG Bathing Instructions   You can play a key role in reducing the risk of infection after surgery. Your skin needs to be as free of germs as possible. You can reduce the number of germs on your skin by washing with CHG (chlorhexidine gluconate) soap before surgery. CHG is an antiseptic soap that kills germs and continues to kill germs even after washing.   DO NOT use if you have an allergy to chlorhexidine/CHG or antibacterial soaps. If your skin becomes reddened or irritated, stop using the CHG and notify one of our RNs at [phone].              TAKE A SHOWER THE NIGHT BEFORE SURGERY AND THE DAY OF SURGERY    Please keep in mind the following:  DO NOT shave, including legs and underarms, 48 hours prior to surgery.   You may shave your face before/day of surgery.  Place clean sheets on your bed the night before surgery Use a clean  washcloth (not used since being washed) for each shower. DO NOT sleep with pet's night before surgery.  CHG Shower Instructions:  Wash your face and private area with normal soap. If you choose to wash your hair, wash first with your normal shampoo.  After you use shampoo/soap, rinse your hair and body thoroughly to remove shampoo/soap residue.  Turn the water OFF and apply half the bottle of CHG soap to a CLEAN washcloth.  Apply CHG soap ONLY FROM YOUR NECK DOWN TO YOUR TOES (washing for 3-5 minutes)  DO NOT use CHG soap on face, private areas, open wounds, or sores.  Pay special attention to the area where your surgery is being performed.  If you are having back surgery,  having someone wash your back for you may be helpful. Wait 2 minutes after CHG soap is applied, then you may rinse off the CHG soap.  Pat dry with a clean towel  Put on clean pajamas    Additional instructions for the day of surgery: DO NOT APPLY any lotions, deodorants, cologne, or perfumes.   Do not wear jewelry or makeup Do not wear nail polish, gel polish, artificial nails, or any other type of covering on natural nails (fingers and toes) Do not bring valuables to the hospital. [facility] is not responsible for valuables/personal belongings. Put on clean/comfortable clothes.  Please brush your teeth.  Ask your nurse before applying any prescription medications to the skin.

## 2023-10-03 ENCOUNTER — Other Ambulatory Visit: Payer: Self-pay

## 2023-10-03 ENCOUNTER — Encounter (HOSPITAL_COMMUNITY): Payer: Self-pay

## 2023-10-03 ENCOUNTER — Encounter (HOSPITAL_COMMUNITY)
Admission: RE | Admit: 2023-10-03 | Discharge: 2023-10-03 | Disposition: A | Discharge status: Home / Self Care | Source: Ambulatory Visit | Attending: General Surgery | Admitting: General Surgery

## 2023-10-03 VITALS — BP 142/92 | HR 88 | Temp 98.3°F | Resp 17 | Ht 70.0 in | Wt 154.0 lb

## 2023-10-03 DIAGNOSIS — Z01818 Encounter for other preprocedural examination: Secondary | ICD-10-CM | POA: Diagnosis present

## 2023-10-03 HISTORY — DX: Other specified health status: Z78.9

## 2023-10-03 LAB — CBC
HCT: 42.5 % (ref 39.0–52.0)
Hemoglobin: 14.2 g/dL (ref 13.0–17.0)
MCH: 29.6 pg (ref 26.0–34.0)
MCHC: 33.4 g/dL (ref 30.0–36.0)
MCV: 88.7 fL (ref 80.0–100.0)
Platelets: 261 10*3/uL (ref 150–400)
RBC: 4.79 MIL/uL (ref 4.22–5.81)
RDW: 12 % (ref 11.5–15.5)
WBC: 5 10*3/uL (ref 4.0–10.5)
nRBC: 0 % (ref 0.0–0.2)

## 2023-10-03 NOTE — Progress Notes (Signed)
 PCP - Dr. Debby Petties Cardiologist - Denies  PPM/ICD - Denies Device Orders - n/a Rep Notified - n/a  Chest x-ray - n/a EKG - Denies any within last couple years (last one 2020) Stress Test - Denies ECHO - Denies Cardiac Cath - Denies  Sleep Study - Denies CPAP - n/a  No DM  Last dose of GLP1 agonist- n/a GLP1 instructions: n/a  Blood Thinner Instructions: n/a Aspirin Instructions: n/a  NPO after midnight  COVID TEST- n/a   Anesthesia review: No  Patient denies shortness of breath, fever, cough and chest pain at PAT appointment. Pt denies any respiratory illness/infection int he last two months.   All instructions explained to the patient, with a verbal understanding of the material. Patient agrees to go over the instructions while at home for a better understanding. Patient also instructed to self quarantine after being tested for COVID-19. The opportunity to ask questions was provided.

## 2023-10-10 NOTE — Anesthesia Preprocedure Evaluation (Signed)
 Anesthesia Evaluation  Patient identified by MRN, date of birth, ID band Patient awake    Reviewed: Allergy & Precautions, NPO status , Patient's Chart, lab work & pertinent test results  History of Anesthesia Complications Negative for: history of anesthetic complications  Airway Mallampati: II  TM Distance: >3 FB Neck ROM: Full    Dental  (+) Dental Advisory Given, Partial Upper   Pulmonary Current Smoker and Patient abstained from smoking.   Pulmonary exam normal        Cardiovascular negative cardio ROS Normal cardiovascular exam     Neuro/Psych negative neurological ROS  negative psych ROS   GI/Hepatic negative GI ROS, Neg liver ROS,,,  Endo/Other  negative endocrine ROS    Renal/GU negative Renal ROS     Musculoskeletal negative musculoskeletal ROS (+)    Abdominal   Peds  Hematology negative hematology ROS (+)   Anesthesia Other Findings   Reproductive/Obstetrics                              Anesthesia Physical Anesthesia Plan  ASA: 2  Anesthesia Plan: General   Post-op Pain Management: Celebrex  PO (pre-op)*   Induction: Intravenous  PONV Risk Score and Plan: 2 and Treatment may vary due to age or medical condition, Ondansetron , Dexamethasone  and Midazolam   Airway Management Planned: LMA  Additional Equipment: None  Intra-op Plan:   Post-operative Plan: Extubation in OR  Informed Consent: I have reviewed the patients History and Physical, chart, labs and discussed the procedure including the risks, benefits and alternatives for the proposed anesthesia with the patient or authorized representative who has indicated his/her understanding and acceptance.     Dental advisory given  Plan Discussed with: CRNA and Anesthesiologist  Anesthesia Plan Comments:          Anesthesia Quick Evaluation

## 2023-10-11 ENCOUNTER — Encounter (HOSPITAL_COMMUNITY): Admitting: Certified Registered Nurse Anesthetist

## 2023-10-11 ENCOUNTER — Ambulatory Visit (HOSPITAL_COMMUNITY)
Admission: RE | Admit: 2023-10-11 | Discharge: 2023-10-11 | Disposition: A | Discharge status: Home / Self Care | Attending: General Surgery | Admitting: General Surgery

## 2023-10-11 ENCOUNTER — Other Ambulatory Visit: Payer: Self-pay

## 2023-10-11 ENCOUNTER — Ambulatory Visit (HOSPITAL_COMMUNITY): Payer: Self-pay | Admitting: Physician Assistant

## 2023-10-11 ENCOUNTER — Encounter (HOSPITAL_COMMUNITY): Payer: Self-pay | Admitting: General Surgery

## 2023-10-11 ENCOUNTER — Encounter (HOSPITAL_COMMUNITY)
Admission: RE | Disposition: A | Discharge status: Home / Self Care | Payer: Self-pay | Source: Home / Self Care | Attending: General Surgery

## 2023-10-11 DIAGNOSIS — F17210 Nicotine dependence, cigarettes, uncomplicated: Secondary | ICD-10-CM | POA: Diagnosis not present

## 2023-10-11 DIAGNOSIS — K4090 Unilateral inguinal hernia, without obstruction or gangrene, not specified as recurrent: Secondary | ICD-10-CM | POA: Insufficient documentation

## 2023-10-11 HISTORY — PX: INGUINAL HERNIA REPAIR: SHX194

## 2023-10-11 HISTORY — PX: INSERTION OF MESH: SHX5868

## 2023-10-11 SURGERY — REPAIR, HERNIA, INGUINAL, ADULT
Anesthesia: General | Site: Groin | Laterality: Left

## 2023-10-11 MED ORDER — FENTANYL CITRATE (PF) 100 MCG/2ML IJ SOLN: 25.0000 ug | INTRAMUSCULAR | Status: DC | PRN

## 2023-10-11 MED ORDER — FENTANYL CITRATE (PF) 250 MCG/5ML IJ SOLN
INTRAMUSCULAR | Status: DC | PRN
  Administered 2023-10-11 (×3): 25 ug via INTRAVENOUS

## 2023-10-11 MED ORDER — CHLORHEXIDINE GLUCONATE CLOTH 2 % EX PADS: 6.0000 | MEDICATED_PAD | Freq: Once | CUTANEOUS | Status: DC

## 2023-10-11 MED ORDER — OXYCODONE HCL 5 MG PO TABS
ORAL_TABLET | ORAL | Status: AC
  Filled 2023-10-11: qty 1

## 2023-10-11 MED ORDER — CHLORHEXIDINE GLUCONATE 0.12 % MT SOLN
15.0000 mL | Freq: Once | OROMUCOSAL | Status: AC
  Administered 2023-10-11: 15 mL via OROMUCOSAL
  Filled 2023-10-11: qty 15

## 2023-10-11 MED ORDER — LIDOCAINE 2% (20 MG/ML) 5 ML SYRINGE
INTRAMUSCULAR | Status: AC
  Filled 2023-10-11: qty 5

## 2023-10-11 MED ORDER — GABAPENTIN 300 MG PO CAPS
300.0000 mg | ORAL_CAPSULE | ORAL | Status: AC
  Administered 2023-10-11: 300 mg via ORAL
  Filled 2023-10-11: qty 1

## 2023-10-11 MED ORDER — ONDANSETRON HCL 4 MG/2ML IJ SOLN
INTRAMUSCULAR | Status: AC
  Filled 2023-10-11: qty 2

## 2023-10-11 MED ORDER — OXYCODONE HCL 5 MG PO TABS
5.0000 mg | ORAL_TABLET | Freq: Once | ORAL | Status: AC | PRN
  Administered 2023-10-11: 5 mg via ORAL

## 2023-10-11 MED ORDER — MIDAZOLAM HCL 2 MG/2ML IJ SOLN
INTRAMUSCULAR | Status: AC
  Filled 2023-10-11: qty 2

## 2023-10-11 MED ORDER — MIDAZOLAM HCL 2 MG/2ML IJ SOLN
INTRAMUSCULAR | Status: DC | PRN
  Administered 2023-10-11: 2 mg via INTRAVENOUS

## 2023-10-11 MED ORDER — BUPIVACAINE-EPINEPHRINE (PF) 0.25% -1:200000 IJ SOLN
INTRAMUSCULAR | Status: AC
  Filled 2023-10-11: qty 30

## 2023-10-11 MED ORDER — SODIUM CHLORIDE 0.9 % IV SOLN
12.5000 mg | INTRAVENOUS | Status: DC | PRN
  Filled 2023-10-11: qty 0.5

## 2023-10-11 MED ORDER — DEXAMETHASONE SODIUM PHOSPHATE 10 MG/ML IJ SOLN
INTRAMUSCULAR | Status: DC | PRN
  Administered 2023-10-11: 10 mg via INTRAVENOUS

## 2023-10-11 MED ORDER — CEFAZOLIN SODIUM-DEXTROSE 2-4 GM/100ML-% IV SOLN
2.0000 g | INTRAVENOUS | Status: AC
  Administered 2023-10-11: 2 g via INTRAVENOUS
  Filled 2023-10-11: qty 100

## 2023-10-11 MED ORDER — PHENYLEPHRINE 80 MCG/ML (10ML) SYRINGE FOR IV PUSH (FOR BLOOD PRESSURE SUPPORT)
PREFILLED_SYRINGE | INTRAVENOUS | Status: AC
  Filled 2023-10-11: qty 10

## 2023-10-11 MED ORDER — DEXMEDETOMIDINE HCL IN NACL 80 MCG/20ML IV SOLN
INTRAVENOUS | Status: DC | PRN
  Administered 2023-10-11: 12 ug via INTRAVENOUS

## 2023-10-11 MED ORDER — PHENYLEPHRINE HCL-NACL 20-0.9 MG/250ML-% IV SOLN
INTRAVENOUS | Status: DC | PRN
  Administered 2023-10-11 (×18): 80 ug via INTRAVENOUS

## 2023-10-11 MED ORDER — BUPIVACAINE-EPINEPHRINE (PF) 0.25% -1:200000 IJ SOLN
INTRAMUSCULAR | Status: DC | PRN
  Administered 2023-10-11: 30 mL

## 2023-10-11 MED ORDER — ONDANSETRON HCL 4 MG/2ML IJ SOLN
INTRAMUSCULAR | Status: DC | PRN
  Administered 2023-10-11: 4 mg via INTRAVENOUS

## 2023-10-11 MED ORDER — CELECOXIB 200 MG PO CAPS
200.0000 mg | ORAL_CAPSULE | ORAL | Status: AC
  Administered 2023-10-11: 200 mg via ORAL
  Filled 2023-10-11: qty 1

## 2023-10-11 MED ORDER — PROPOFOL 10 MG/ML IV BOLUS
INTRAVENOUS | Status: DC | PRN
  Administered 2023-10-11: 200 mg via INTRAVENOUS

## 2023-10-11 MED ORDER — FENTANYL CITRATE (PF) 250 MCG/5ML IJ SOLN
INTRAMUSCULAR | Status: AC
  Filled 2023-10-11: qty 5

## 2023-10-11 MED ORDER — OXYCODONE HCL 5 MG PO TABS: 5.0000 mg | ORAL_TABLET | ORAL | 0 refills | Status: AC | PRN

## 2023-10-11 MED ORDER — DEXMEDETOMIDINE HCL IN NACL 80 MCG/20ML IV SOLN
INTRAVENOUS | Status: AC
  Filled 2023-10-11: qty 20

## 2023-10-11 MED ORDER — DEXAMETHASONE SODIUM PHOSPHATE 10 MG/ML IJ SOLN
INTRAMUSCULAR | Status: AC
  Filled 2023-10-11: qty 1

## 2023-10-11 MED ORDER — LIDOCAINE 2% (20 MG/ML) 5 ML SYRINGE
INTRAMUSCULAR | Status: DC | PRN
  Administered 2023-10-11: 60 mg via INTRAVENOUS

## 2023-10-11 MED ORDER — OXYCODONE HCL 5 MG/5ML PO SOLN: 5.0000 mg | Freq: Once | ORAL | Status: AC | PRN

## 2023-10-11 MED ORDER — 0.9 % SODIUM CHLORIDE (POUR BTL) OPTIME
TOPICAL | Status: DC | PRN
  Administered 2023-10-11: 1000 mL

## 2023-10-11 MED ORDER — LACTATED RINGERS IV SOLN: INTRAVENOUS | Status: DC

## 2023-10-11 MED ORDER — ORAL CARE MOUTH RINSE
15.0000 mL | Freq: Once | OROMUCOSAL | Status: AC
Start: 2023-10-11 — End: 2023-10-11

## 2023-10-11 MED ORDER — ENOXAPARIN SODIUM 40 MG/0.4ML IJ SOSY
40.0000 mg | PREFILLED_SYRINGE | Freq: Once | INTRAMUSCULAR | Status: AC
  Administered 2023-10-11: 40 mg via SUBCUTANEOUS
  Filled 2023-10-11: qty 0.4

## 2023-10-11 MED ORDER — PROPOFOL 10 MG/ML IV BOLUS
INTRAVENOUS | Status: AC
  Filled 2023-10-11: qty 20

## 2023-10-11 MED ORDER — AMISULPRIDE (ANTIEMETIC) 5 MG/2ML IV SOLN: 10.0000 mg | Freq: Once | INTRAVENOUS | Status: DC | PRN

## 2023-10-11 MED ORDER — ACETAMINOPHEN 500 MG PO TABS
1000.0000 mg | ORAL_TABLET | ORAL | Status: AC
  Administered 2023-10-11: 1000 mg via ORAL
  Filled 2023-10-11: qty 2

## 2023-10-11 SURGICAL SUPPLY — 34 items
BAG COUNTER SPONGE SURGICOUNT (BAG) ×1 IMPLANT
BLADE CLIPPER SURG (BLADE) IMPLANT
CANISTER SUCTION 3000ML PPV (SUCTIONS) IMPLANT
CHLORAPREP W/TINT 26 (MISCELLANEOUS) ×1 IMPLANT
COVER SURGICAL LIGHT HANDLE (MISCELLANEOUS) ×1 IMPLANT
DERMABOND ADVANCED .7 DNX12 (GAUZE/BANDAGES/DRESSINGS) ×1 IMPLANT
DERMABOND ADVANCED .7 DNX6 (GAUZE/BANDAGES/DRESSINGS) IMPLANT
DRAIN PENROSE .5X12 LATEX STL (DRAIN) IMPLANT
DRAPE LAPAROTOMY TRNSV 102X78 (DRAPES) ×1 IMPLANT
ELECTRODE REM PT RTRN 9FT ADLT (ELECTROSURGICAL) ×1 IMPLANT
GAUZE 4X4 16PLY ~~LOC~~+RFID DBL (SPONGE) ×1 IMPLANT
GLOVE BIOGEL PI IND STRL 6 (GLOVE) ×1 IMPLANT
GLOVE BIOGEL PI MICRO STRL 5.5 (GLOVE) ×1 IMPLANT
GOWN STRL REUS W/ TWL LRG LVL3 (GOWN DISPOSABLE) ×2 IMPLANT
KIT BASIN OR (CUSTOM PROCEDURE TRAY) ×1 IMPLANT
KIT TURNOVER KIT B (KITS) ×1 IMPLANT
MESH PARIETEX PROGRIP LEFT (Mesh General) IMPLANT
NDL HYPO 25GX1X1/2 BEV (NEEDLE) ×1 IMPLANT
NEEDLE HYPO 25GX1X1/2 BEV (NEEDLE) ×1 IMPLANT
NS IRRIG 1000ML POUR BTL (IV SOLUTION) ×1 IMPLANT
PACK GENERAL/GYN (CUSTOM PROCEDURE TRAY) ×1 IMPLANT
PAD ARMBOARD POSITIONER FOAM (MISCELLANEOUS) ×1 IMPLANT
PENCIL SMOKE EVACUATOR (MISCELLANEOUS) IMPLANT
SPIKE FLUID TRANSFER (MISCELLANEOUS) ×1 IMPLANT
SPONGE INTESTINAL PEANUT (DISPOSABLE) IMPLANT
SPONGE T-LAP 18X18 ~~LOC~~+RFID (SPONGE) ×1 IMPLANT
SUT MNCRL AB 4-0 PS2 18 (SUTURE) ×1 IMPLANT
SUT PDS AB 0 CT1 27 (SUTURE) ×2 IMPLANT
SUT SILK 2 0 SH (SUTURE) IMPLANT
SUT VIC AB 2-0 SH 27X BRD (SUTURE) ×1 IMPLANT
SUT VIC AB 3-0 SH 27X BRD (SUTURE) ×2 IMPLANT
SYR CONTROL 10ML LL (SYRINGE) ×1 IMPLANT
TOWEL GREEN STERILE (TOWEL DISPOSABLE) ×1 IMPLANT
TOWEL GREEN STERILE FF (TOWEL DISPOSABLE) ×1 IMPLANT

## 2023-10-11 NOTE — Anesthesia Postprocedure Evaluation (Signed)
 Anesthesia Post Note  Patient: Jimmy Mcknight  Procedure(s) Performed: REPAIR, LEFT HERNIA, INGUINAL, ADULT (Left: Groin) INSERTION OF PROGRIP MESH, LEFT GROIN (Left: Groin)     Patient location during evaluation: PACU Anesthesia Type: General Level of consciousness: awake and alert Pain management: pain level controlled Vital Signs Assessment: post-procedure vital signs reviewed and stable Respiratory status: spontaneous breathing, nonlabored ventilation and respiratory function stable Cardiovascular status: stable and blood pressure returned to baseline Anesthetic complications: no   No notable events documented.  Last Vitals:  Vitals:   10/11/23 0929 10/11/23 0930  BP:  106/76  Pulse: 82 88  Resp: 13 15  Temp:  36.6 C  SpO2: 95% 96%    Last Pain:  Vitals:   10/11/23 0929  TempSrc:   PainSc: 4                  Debby FORBES Like

## 2023-10-11 NOTE — Transfer of Care (Signed)
 Immediate Anesthesia Transfer of Care Note  Patient: Jimmy Mcknight  Procedure(s) Performed: REPAIR, LEFT HERNIA, INGUINAL, ADULT (Left: Groin) INSERTION OF PROGRIP MESH, LEFT GROIN (Left: Groin)  Patient Location: PACU  Anesthesia Type:General  Level of Consciousness: drowsy, patient cooperative, and responds to stimulation  Airway & Oxygen Therapy: Patient Spontanous Breathing  Post-op Assessment: Report given to RN, Post -op Vital signs reviewed and stable, Patient moving all extremities X 4, and Patient able to stick tongue midline  Post vital signs: Reviewed and stable  Last Vitals:  Vitals Value Taken Time  BP 96/57 10/11/23 08:57  Temp 97.9   Pulse 73 10/11/23 08:59  Resp 10 10/11/23 08:59  SpO2 94 % 10/11/23 08:59  Vitals shown include unfiled device data.  Last Pain:  Vitals:   10/11/23 0622  TempSrc:   PainSc: 2       Patients Stated Pain Goal: 0 (10/11/23 9390)  Complications: No notable events documented.

## 2023-10-11 NOTE — H&P (Signed)
    HPI  Jimmy Mcknight is an 48 y.o. male who was seen in clinic on 09/09/23 for left inguinal hernia.  Patient noted pain to left inguinal region about a month ago after heavy lifting. Pain intermittent but since first started does recur regularly. No nausea/emesis, continues to pass flatus and have bowel movements, no obstructive symptoms. No overlying skin changes. No obvious bulge.   No changes since seen in clinic.  10 point review of systems is negative except as listed above in HPI.  Objective  Past Medical History: Past Medical History:  Diagnosis Date   Medical history non-contributory     Past Surgical History: Past Surgical History:  Procedure Laterality Date   ANKLE SURGERY Left 2015   Tendon/Ligament Repair    Family History:  History reviewed. No pertinent family history.  Social History:  reports that he quit smoking about 13 months ago. His smoking use included cigarettes. He has a 7.5 pack-year smoking history. He has quit using smokeless tobacco. He reports current alcohol use. He reports that he does not use drugs.  Allergies:  Allergies  Allergen Reactions   Tylenol  With Codeine #3 [Acetaminophen -Codeine]    Tylenol  [Acetaminophen ]     Tylenol  3 upsets stomach    Medications: I have reviewed the patient's current medications.  Labs: Pertinent lab work personally reviewed.  Imaging: Pertinent imaging personally reviewed  Physical Exam Blood pressure 129/80, pulse (!) 57, temperature 98.7 F (37.1 C), temperature source Oral, resp. rate 18, height 5' 10 (1.778 m), weight 70.3 kg, SpO2 97%. General: No acute distress, well appearing HEENT: PERRL, hearing grossly normal, mucous membranes moist CV: Regular rate and rhythm Pulm: Normal work of breathing on room air Abd: Soft, nontender, nondistended GU: Small hernia felt with valsalva when palpating external ring Extremities: Warm and well perfused Neuro: A&O x4, no focal neurologic deficits Psych:  Appropriate mood and effect     Assessment   Jimmy Mcknight is an 48 y.o. male who presents today for left inguinal hernia repair with mesh  Plan  - Proceed to OR for open left inguinal hernia repair with mesh - - We discussed risks of surgery including but not limited to: bleeding, infection, seroma, hematoma, recurrence, damage to surrounding structures (specifically discussed nerve injury, injury spermatic cord including contained vessels, vas deferens), chronic pain. We discussed possibility of needing to ligate nerve to prevent chronic pain but that this may cause some numbness to inguinal region/scrotum/medial thigh. Patient understands these risks and wishes to proceed with surgery.    Orie Silversmith, MD [facility]

## 2023-10-11 NOTE — Anesthesia Procedure Notes (Signed)
 Procedure Name: LMA Insertion Date/Time: 10/11/2023 7:33 AM  Performed by: Harrold Macintosh, CRNAPre-anesthesia Checklist: Patient identified, Emergency Drugs available, Suction available, Patient being monitored and Timeout performed Patient Re-evaluated:Patient Re-evaluated prior to induction Oxygen Delivery Method: Circle system utilized Preoxygenation: Pre-oxygenation with 100% oxygen Induction Type: IV induction LMA: LMA inserted LMA Size: 5.0 Number of attempts: 1 Placement Confirmation: positive ETCO2 and breath sounds checked- equal and bilateral Tube secured with: Tape Dental Injury: Teeth and Oropharynx as per pre-operative assessment

## 2023-10-11 NOTE — Op Note (Signed)
 Inguinal Hernia Repair Procedure Note   Pre-operative Diagnosis:  Left Inguinal Hernia.  Post-operative Diagnosis:  Left Direct Inguinal Hernia   Procedure (s) Performed:  Open left inginal hernia repair with mesh (Lichtenstein procedure).   Teaching Surgeon:  Orie Silversmith, MD.  Anesthesia:  General  Anesthesiologist:  Lucious Debby BRAVO, MD  Specimens: None         Drains: None.   Cultures:  None.  Estimated Blood Loss:  Minimal.               Complications:  None; patient tolerated the procedure well.  Indications:  Jimmy Mcknight has had symptoms and physical exam findings consistent with inguinal hernia and desired repair. The risks, benefits and alternatives to treatment were discussed with the patient and understanding these, the patient wished to proceed.   Operative Findings:  There was an direct inguinal hernia with reducible preperitoneal fat.  Procedure Details:  The patient was positively identified and consent was verified in the holding area. The operative site was verified with the patient and marked appropriately in the holding area. The patient was taken from the holding area to the operating room and placed in supine position on the operating room table, being careful to pad all pressure points and to secure the patient safely to the table. No antibiotics were given because none were indicated. Heparin 5000 units was given subcutaneously for DVT prophylaxis. Sequential compression devices were placed for DVT prophylaxis.  Prior to beginning anesthesia, a timeout was called to verify the patient's identification, the patient's allergies and the intended procedure.  Anesthesia was induced. The patient was prepped and draped in the usual sterile fashion. A second timeout was called to verify the patient's identification, the intended procedure and the patient's allergies.  The procedure was begun by identifying the anterior superior iliac spine and the pubic tubercle. The  skin was marked for an oblique incision from just medial to the palpated pubic tubercle and superior to the inguinal ligament for a length of approximately 6 cm. Local anesthesia was achieved by infiltration of 10 ml of 0.25% marcaine  with epi.  After infiltration of local anesthesia, a skin incision was made with a #10 blade. The subcutaneous tissue was dissected with electrocautery. A single crossing superficial epigastric vein was isolated and divided. The external oblique fascia was identified. A #15 blade was used to make a small nick in the fascia. The fascia was elevated. The external oblique fascia was cleared on the underside bluntly with closed scissors and scissors were then used to make an incision sharply in the external oblique in the direction of its fibers down through the external ring. The external oblique fascia was retracted laterally with hemostats. The undersurface of the external oblique was cleared bluntly with a raytec and manual dissection. The ilioinguinal and iliohypogastric nerves were all identified and protected. The spermatic cord was isolated anterior to the pubic tubercle and encircled with a Penrose drain. After elevating the spermatic cord from the floor of the inguinal canal, the cremasteric muscle fibers overlying the spermatic cord structures were opened in the direction of their fibers. After careful exploration of the cord, a direct was identified. Sutures of 3-0 Vicryl were used to loosely plicate the area of the direct inguinal hernia defect to ease placement of the mesh. A piece of progrip mesh was secured to the pubic tubercle with 0 vicryl stitch.  The mesh was passed around spermatic cord to recreate internal ring. The ilioinguinal nerve was  unable to be isolated away from mesh so to prevent chronic pain, this was intentionally ligated. At the completion of mesh fixation, there was no tension on the mesh and no bunching. The wound was irrigated and hemostasis was  ensured. The external oblique was closed with 2-0 Vicryl running sutures recreating the external ring. Scarpa's fascia was closed with interrupted 3-0 Vicryl suture. Additional local anesthetic was injected. The skin was closed with interrupted 3-0 vicryl deep dermals and running subcuticular 4-0 Monocryl suture and dermabond. The patient was awakened and taken from the Operating Room to recovery area in stable condition.   Condition: Stable  Disposition: PACU - hemodynamically stable.   Richerd Silversmith, MD  10/11/2023 8:50 AM

## 2023-10-12 ENCOUNTER — Encounter (HOSPITAL_COMMUNITY): Payer: Self-pay | Admitting: General Surgery

## 2023-12-07 ENCOUNTER — Encounter: Payer: Self-pay | Admitting: Sports Medicine
